# Patient Record
Sex: Female | Born: 1975 | Race: White | Hispanic: No | Smoking: Former smoker
Health system: Southern US, Community
[De-identification: ages and names within clinical notes are randomized; demographics above are authoritative.]

## PROBLEM LIST (undated history)

## (undated) DIAGNOSIS — R350 Frequency of micturition: Secondary | ICD-10-CM

## (undated) DIAGNOSIS — Z6841 Body Mass Index (BMI) 40.0 and over, adult: Secondary | ICD-10-CM

## (undated) DIAGNOSIS — E119 Type 2 diabetes mellitus without complications: Secondary | ICD-10-CM

## (undated) DIAGNOSIS — N289 Disorder of kidney and ureter, unspecified: Secondary | ICD-10-CM

## (undated) DIAGNOSIS — G4733 Obstructive sleep apnea (adult) (pediatric): Secondary | ICD-10-CM

## (undated) DIAGNOSIS — Z8619 Personal history of other infectious and parasitic diseases: Secondary | ICD-10-CM

## (undated) DIAGNOSIS — E1140 Type 2 diabetes mellitus with diabetic neuropathy, unspecified: Secondary | ICD-10-CM

## (undated) DIAGNOSIS — J449 Chronic obstructive pulmonary disease, unspecified: Secondary | ICD-10-CM

## (undated) DIAGNOSIS — Z973 Presence of spectacles and contact lenses: Secondary | ICD-10-CM

## (undated) DIAGNOSIS — R300 Dysuria: Secondary | ICD-10-CM

## (undated) DIAGNOSIS — E349 Endocrine disorder, unspecified: Secondary | ICD-10-CM

## (undated) DIAGNOSIS — Z972 Presence of dental prosthetic device (complete) (partial): Secondary | ICD-10-CM

## (undated) DIAGNOSIS — Z9989 Dependence on other enabling machines and devices: Secondary | ICD-10-CM

## (undated) DIAGNOSIS — D649 Anemia, unspecified: Secondary | ICD-10-CM

## (undated) DIAGNOSIS — R0600 Dyspnea, unspecified: Secondary | ICD-10-CM

## (undated) DIAGNOSIS — R3915 Urgency of urination: Secondary | ICD-10-CM

## (undated) DIAGNOSIS — I10 Essential (primary) hypertension: Secondary | ICD-10-CM

## (undated) DIAGNOSIS — J40 Bronchitis, not specified as acute or chronic: Secondary | ICD-10-CM

## (undated) DIAGNOSIS — R0609 Other forms of dyspnea: Secondary | ICD-10-CM

---

## 2016-02-20 ENCOUNTER — Ambulatory Visit: Payer: Self-pay | Attending: Oncology | Admitting: *Deleted

## 2016-02-20 ENCOUNTER — Other Ambulatory Visit: Payer: Self-pay | Admitting: *Deleted

## 2016-02-20 ENCOUNTER — Ambulatory Visit
Admission: RE | Admit: 2016-02-20 | Discharge: 2016-02-20 | Disposition: A | Discharge status: Home / Self Care | Payer: Self-pay | Source: Ambulatory Visit | Attending: Oncology | Admitting: Oncology

## 2016-02-20 VITALS — BP 135/85 | HR 83 | Temp 98.5°F | Ht 64.17 in | Wt 311.8 lb

## 2016-02-20 DIAGNOSIS — Z0000 Encounter for general adult medical examination without abnormal findings: Secondary | ICD-10-CM

## 2016-02-20 NOTE — Progress Notes (Signed)
Subjective:     Patient ID: Haley SicksBetsy Rains, female   DOB: Nov 15, 1975, 40 y.o.   MRN: [id number]  HPI   Review of Systems     Objective:   Physical Exam  Pulmonary/Chest: Right breast exhibits no inverted nipple, no mass, no nipple discharge, no skin change and no tenderness. Left breast exhibits no inverted nipple, no mass, no nipple discharge, no skin change and no tenderness. Breasts are symmetrical.       Assessment:     40 year old White female referred to [facility] by [facility] for clinical breast exam and mammogram.  Clinical breast exam unremarkable.  Taught self breast awareness.  Patient has been screened for eligibility.  She does not have any insurance, Medicare or Medicaid.  She also meets financial eligibility.  Hand-out given on the Affordable Care Act.    Plan:     Screening mammogram ordered.  Will follow-up per [facility] protocol.

## 2016-02-21 ENCOUNTER — Encounter: Payer: Self-pay | Admitting: *Deleted

## 2016-02-21 NOTE — Progress Notes (Signed)
Letter mailed from the [facility] to inform patient of her normal mammogram results.  Patient is to follow-up with annual screening in one year.  HSIS to Christy. 

## 2016-02-21 NOTE — Patient Instructions (Signed)
Gave patient hand-out, Women Staying Healthy, Active and Well from [facility], with education on breast health, pap smears, heart and colon health. 

## 2018-04-19 ENCOUNTER — Other Ambulatory Visit: Payer: Self-pay

## 2018-04-19 ENCOUNTER — Inpatient Hospital Stay (HOSPITAL_COMMUNITY)
Admission: EM | Admit: 2018-04-19 | Discharge: 2018-04-22 | DRG: 854 | Disposition: A | Discharge status: Home / Self Care | Payer: Medicare HMO | Attending: Internal Medicine | Admitting: Internal Medicine

## 2018-04-19 ENCOUNTER — Inpatient Hospital Stay (HOSPITAL_COMMUNITY): Payer: Medicare HMO | Admitting: Anesthesiology

## 2018-04-19 ENCOUNTER — Inpatient Hospital Stay (HOSPITAL_COMMUNITY): Payer: Medicare HMO

## 2018-04-19 ENCOUNTER — Encounter (HOSPITAL_COMMUNITY): Payer: Self-pay | Admitting: Emergency Medicine

## 2018-04-19 ENCOUNTER — Emergency Department (HOSPITAL_COMMUNITY): Payer: Medicare HMO

## 2018-04-19 ENCOUNTER — Encounter (HOSPITAL_COMMUNITY)
Admission: EM | Disposition: A | Discharge status: Home / Self Care | Payer: Self-pay | Source: Home / Self Care | Attending: Internal Medicine

## 2018-04-19 DIAGNOSIS — I10 Essential (primary) hypertension: Secondary | ICD-10-CM | POA: Diagnosis present

## 2018-04-19 DIAGNOSIS — Z7984 Long term (current) use of oral hypoglycemic drugs: Secondary | ICD-10-CM

## 2018-04-19 DIAGNOSIS — A419 Sepsis, unspecified organism: Principal | ICD-10-CM

## 2018-04-19 DIAGNOSIS — G4733 Obstructive sleep apnea (adult) (pediatric): Secondary | ICD-10-CM | POA: Diagnosis present

## 2018-04-19 DIAGNOSIS — N136 Pyonephrosis: Secondary | ICD-10-CM | POA: Diagnosis present

## 2018-04-19 DIAGNOSIS — N132 Hydronephrosis with renal and ureteral calculous obstruction: Secondary | ICD-10-CM | POA: Diagnosis present

## 2018-04-19 DIAGNOSIS — Z7951 Long term (current) use of inhaled steroids: Secondary | ICD-10-CM | POA: Diagnosis not present

## 2018-04-19 DIAGNOSIS — Z808 Family history of malignant neoplasm of other organs or systems: Secondary | ICD-10-CM | POA: Diagnosis not present

## 2018-04-19 DIAGNOSIS — E1142 Type 2 diabetes mellitus with diabetic polyneuropathy: Secondary | ICD-10-CM | POA: Diagnosis present

## 2018-04-19 DIAGNOSIS — Z91040 Latex allergy status: Secondary | ICD-10-CM | POA: Diagnosis not present

## 2018-04-19 DIAGNOSIS — Z885 Allergy status to narcotic agent status: Secondary | ICD-10-CM | POA: Diagnosis not present

## 2018-04-19 DIAGNOSIS — N179 Acute kidney failure, unspecified: Secondary | ICD-10-CM | POA: Diagnosis present

## 2018-04-19 DIAGNOSIS — E119 Type 2 diabetes mellitus without complications: Secondary | ICD-10-CM

## 2018-04-19 DIAGNOSIS — Z87891 Personal history of nicotine dependence: Secondary | ICD-10-CM

## 2018-04-19 DIAGNOSIS — Z8052 Family history of malignant neoplasm of bladder: Secondary | ICD-10-CM

## 2018-04-19 DIAGNOSIS — Z8619 Personal history of other infectious and parasitic diseases: Secondary | ICD-10-CM

## 2018-04-19 DIAGNOSIS — N1330 Unspecified hydronephrosis: Secondary | ICD-10-CM

## 2018-04-19 DIAGNOSIS — E872 Acidosis, unspecified: Secondary | ICD-10-CM | POA: Diagnosis present

## 2018-04-19 DIAGNOSIS — E785 Hyperlipidemia, unspecified: Secondary | ICD-10-CM | POA: Diagnosis present

## 2018-04-19 DIAGNOSIS — N390 Urinary tract infection, site not specified: Secondary | ICD-10-CM | POA: Diagnosis present

## 2018-04-19 DIAGNOSIS — J449 Chronic obstructive pulmonary disease, unspecified: Secondary | ICD-10-CM | POA: Diagnosis present

## 2018-04-19 DIAGNOSIS — Z6841 Body Mass Index (BMI) 40.0 and over, adult: Secondary | ICD-10-CM

## 2018-04-19 DIAGNOSIS — N10 Acute tubulo-interstitial nephritis: Secondary | ICD-10-CM

## 2018-04-19 DIAGNOSIS — N12 Tubulo-interstitial nephritis, not specified as acute or chronic: Secondary | ICD-10-CM | POA: Diagnosis not present

## 2018-04-19 DIAGNOSIS — N131 Hydronephrosis with ureteral stricture, not elsewhere classified: Secondary | ICD-10-CM

## 2018-04-19 DIAGNOSIS — D72829 Elevated white blood cell count, unspecified: Secondary | ICD-10-CM | POA: Diagnosis not present

## 2018-04-19 HISTORY — DX: Personal history of other infectious and parasitic diseases: Z86.19

## 2018-04-19 HISTORY — DX: Essential (primary) hypertension: I10

## 2018-04-19 HISTORY — DX: Chronic obstructive pulmonary disease, unspecified: J44.9

## 2018-04-19 HISTORY — PX: CYSTOSCOPY WITH STENT PLACEMENT: SHX5790

## 2018-04-19 LAB — BASIC METABOLIC PANEL
Anion gap: 14 (ref 5–15)
BUN: 35 mg/dL — AB (ref 6–20)
CHLORIDE: 101 mmol/L (ref 98–111)
CO2: 21 mmol/L — AB (ref 22–32)
CREATININE: 2.75 mg/dL — AB (ref 0.44–1.00)
Calcium: 8.4 mg/dL — ABNORMAL LOW (ref 8.9–10.3)
GFR calc Af Amer: 23 mL/min — ABNORMAL LOW (ref >60)
GFR calc non Af Amer: 20 mL/min — ABNORMAL LOW (ref >60)
Glucose, Bld: 168 mg/dL — ABNORMAL HIGH (ref 70–99)
POTASSIUM: 5 mmol/L (ref 3.5–5.1)
Sodium: 136 mmol/L (ref 135–145)

## 2018-04-19 LAB — CBC WITH DIFFERENTIAL/PLATELET
BASOS ABS: 0 10*3/uL (ref 0.0–0.1)
Basophils Relative: 0 %
Eosinophils Absolute: 0 10*3/uL (ref 0.0–0.7)
Eosinophils Relative: 0 %
HEMATOCRIT: 39.9 % (ref 36.0–46.0)
Hemoglobin: 13.5 g/dL (ref 12.0–15.0)
LYMPHS PCT: 5 %
Lymphs Abs: 1.1 10*3/uL (ref 0.7–4.0)
MCH: 31.9 pg (ref 26.0–34.0)
MCHC: 33.8 g/dL (ref 30.0–36.0)
MCV: 94.3 fL (ref 78.0–100.0)
MONO ABS: 1.9 10*3/uL — AB (ref 0.1–1.0)
Monocytes Relative: 9 %
NEUTROS ABS: 17.7 10*3/uL — AB (ref 1.7–7.7)
Neutrophils Relative %: 86 %
Platelets: 191 10*3/uL (ref 150–400)
RBC: 4.23 MIL/uL (ref 3.87–5.11)
RDW: 15.7 % — AB (ref 11.5–15.5)
WBC: 20.7 10*3/uL — ABNORMAL HIGH (ref 4.0–10.5)

## 2018-04-19 LAB — HEPATIC FUNCTION PANEL
ALK PHOS: 51 U/L (ref 38–126)
ALT: 22 U/L (ref 0–44)
AST: 15 U/L (ref 15–41)
Albumin: 3.1 g/dL — ABNORMAL LOW (ref 3.5–5.0)
BILIRUBIN DIRECT: 0.3 mg/dL — AB (ref 0.0–0.2)
BILIRUBIN INDIRECT: 0.8 mg/dL (ref 0.3–0.9)
Total Bilirubin: 1.1 mg/dL (ref 0.3–1.2)
Total Protein: 6.3 g/dL — ABNORMAL LOW (ref 6.5–8.1)

## 2018-04-19 LAB — URINALYSIS, ROUTINE W REFLEX MICROSCOPIC
Glucose, UA: NEGATIVE mg/dL
KETONES UR: NEGATIVE mg/dL
Nitrite: NEGATIVE
PROTEIN: 30 mg/dL — AB
Specific Gravity, Urine: 1.024 (ref 1.005–1.030)
WBC, UA: >50 WBC/hpf — ABNORMAL HIGH (ref 0–5)
pH: 5 (ref 5.0–8.0)

## 2018-04-19 LAB — I-STAT CG4 LACTIC ACID, ED: Lactic Acid, Venous: 4.02 mmol/L (ref 0.5–1.9)

## 2018-04-19 LAB — CBG MONITORING, ED: Glucose-Capillary: 139 mg/dL — ABNORMAL HIGH (ref 70–99)

## 2018-04-19 LAB — LIPASE, BLOOD: Lipase: 17 U/L (ref 11–51)

## 2018-04-19 LAB — LACTIC ACID, PLASMA: Lactic Acid, Venous: 3.3 mmol/L (ref 0.5–1.9)

## 2018-04-19 LAB — PREGNANCY, URINE: PREG TEST UR: NEGATIVE

## 2018-04-19 SURGERY — CYSTOSCOPY, WITH STENT INSERTION
Anesthesia: General | Laterality: Right

## 2018-04-19 MED ORDER — INSULIN GLARGINE 100 UNIT/ML ~~LOC~~ SOLN
10.0000 [IU] | Freq: Every day | SUBCUTANEOUS | Status: DC
  Administered 2018-04-19: 10 [IU] via SUBCUTANEOUS
  Filled 2018-04-19 (×2): qty 0.1

## 2018-04-19 MED ORDER — SODIUM CHLORIDE 0.9 % IV SOLN
1000.0000 mL | INTRAVENOUS | Status: DC
  Administered 2018-04-20 – 2018-04-22 (×4): 1000 mL via INTRAVENOUS

## 2018-04-19 MED ORDER — INSULIN ASPART 100 UNIT/ML ~~LOC~~ SOLN: 0.0000 [IU] | Freq: Every day | SUBCUTANEOUS | Status: DC

## 2018-04-19 MED ORDER — FENTANYL CITRATE (PF) 100 MCG/2ML IJ SOLN
INTRAMUSCULAR | Status: DC | PRN
  Administered 2018-04-19: 100 ug via INTRAVENOUS
  Administered 2018-04-19: 50 ug via INTRAVENOUS

## 2018-04-19 MED ORDER — MIDAZOLAM HCL 2 MG/2ML IJ SOLN
INTRAMUSCULAR | Status: AC
  Filled 2018-04-19: qty 2

## 2018-04-19 MED ORDER — SODIUM CHLORIDE 0.9 % IR SOLN
Status: DC | PRN
  Administered 2018-04-19: 1000 mL

## 2018-04-19 MED ORDER — SODIUM CHLORIDE 0.9 % IV BOLUS
1000.0000 mL | Freq: Once | INTRAVENOUS | Status: AC
  Administered 2018-04-19: 1000 mL via INTRAVENOUS

## 2018-04-19 MED ORDER — LACTATED RINGERS IV SOLN: INTRAVENOUS | Status: DC | PRN

## 2018-04-19 MED ORDER — ACETAMINOPHEN 325 MG PO TABS
650.0000 mg | ORAL_TABLET | Freq: Four times a day (QID) | ORAL | Status: DC | PRN
  Administered 2018-04-20 – 2018-04-22 (×3): 650 mg via ORAL
  Filled 2018-04-19 (×3): qty 2

## 2018-04-19 MED ORDER — SENNOSIDES-DOCUSATE SODIUM 8.6-50 MG PO TABS
1.0000 | ORAL_TABLET | Freq: Every evening | ORAL | Status: DC | PRN
Start: 2018-04-19 — End: 2018-04-22

## 2018-04-19 MED ORDER — FENTANYL CITRATE (PF) 100 MCG/2ML IJ SOLN
50.0000 ug | Freq: Once | INTRAMUSCULAR | Status: AC
  Administered 2018-04-19: 50 ug via INTRAVENOUS
  Filled 2018-04-19: qty 2

## 2018-04-19 MED ORDER — GABAPENTIN 300 MG PO CAPS
300.0000 mg | ORAL_CAPSULE | Freq: Three times a day (TID) | ORAL | Status: DC
  Administered 2018-04-19 – 2018-04-22 (×8): 300 mg via ORAL
  Filled 2018-04-19 (×8): qty 1

## 2018-04-19 MED ORDER — MOMETASONE FURO-FORMOTEROL FUM 200-5 MCG/ACT IN AERO
2.0000 | INHALATION_SPRAY | Freq: Two times a day (BID) | RESPIRATORY_TRACT | Status: DC
  Administered 2018-04-19 – 2018-04-22 (×6): 2 via RESPIRATORY_TRACT
  Filled 2018-04-19 (×2): qty 8.8

## 2018-04-19 MED ORDER — EPHEDRINE SULFATE 50 MG/ML IJ SOLN
INTRAMUSCULAR | Status: DC | PRN
  Administered 2018-04-19: 15 mg via INTRAVENOUS

## 2018-04-19 MED ORDER — ONDANSETRON HCL 4 MG/2ML IJ SOLN
4.0000 mg | Freq: Once | INTRAMUSCULAR | Status: AC
  Administered 2018-04-19: 4 mg via INTRAVENOUS
  Filled 2018-04-19: qty 2

## 2018-04-19 MED ORDER — SODIUM CHLORIDE 0.9 % IV SOLN
INTRAVENOUS | Status: DC | PRN
  Administered 2018-04-19: 18:00:00 via INTRAVENOUS

## 2018-04-19 MED ORDER — EPHEDRINE SULFATE 50 MG/ML IJ SOLN
INTRAMUSCULAR | Status: AC
  Filled 2018-04-19: qty 1

## 2018-04-19 MED ORDER — PRAVASTATIN SODIUM 40 MG PO TABS
40.0000 mg | ORAL_TABLET | Freq: Every day | ORAL | Status: DC
  Administered 2018-04-20 – 2018-04-21 (×2): 40 mg via ORAL
  Filled 2018-04-19 (×2): qty 1

## 2018-04-19 MED ORDER — ONDANSETRON HCL 4 MG PO TABS: 4.0000 mg | ORAL_TABLET | Freq: Four times a day (QID) | ORAL | Status: DC | PRN

## 2018-04-19 MED ORDER — FENTANYL CITRATE (PF) 250 MCG/5ML IJ SOLN
INTRAMUSCULAR | Status: AC
  Filled 2018-04-19: qty 5

## 2018-04-19 MED ORDER — LACTATED RINGERS IV SOLN: INTRAVENOUS | Status: DC

## 2018-04-19 MED ORDER — SODIUM CHLORIDE 0.9 % IV SOLN
1.0000 g | Freq: Once | INTRAVENOUS | Status: AC
  Administered 2018-04-19: 1 g via INTRAVENOUS
  Filled 2018-04-19: qty 10

## 2018-04-19 MED ORDER — FLUTICASONE PROPIONATE 50 MCG/ACT NA SUSP
1.0000 | Freq: Every day | NASAL | Status: DC
  Administered 2018-04-19 – 2018-04-20 (×2): 1 via NASAL
  Filled 2018-04-19 (×2): qty 16

## 2018-04-19 MED ORDER — PROPOFOL 10 MG/ML IV BOLUS
INTRAVENOUS | Status: DC | PRN
  Administered 2018-04-19: 200 mg via INTRAVENOUS

## 2018-04-19 MED ORDER — AMLODIPINE BESYLATE 5 MG PO TABS: 5.0000 mg | ORAL_TABLET | Freq: Every day | ORAL | Status: DC

## 2018-04-19 MED ORDER — MEPERIDINE HCL 50 MG/ML IJ SOLN: 6.2500 mg | INTRAMUSCULAR | Status: DC | PRN

## 2018-04-19 MED ORDER — SUCCINYLCHOLINE CHLORIDE 20 MG/ML IJ SOLN
INTRAMUSCULAR | Status: DC | PRN
  Administered 2018-04-19: 100 mg via INTRAVENOUS

## 2018-04-19 MED ORDER — DIATRIZOATE MEGLUMINE 30 % UR SOLN
URETHRAL | Status: DC | PRN
  Administered 2018-04-19: 20 mL via URETHRAL

## 2018-04-19 MED ORDER — INSULIN ASPART 100 UNIT/ML ~~LOC~~ SOLN
0.0000 [IU] | Freq: Three times a day (TID) | SUBCUTANEOUS | Status: DC
  Administered 2018-04-20: 3 [IU] via SUBCUTANEOUS

## 2018-04-19 MED ORDER — HYDROCODONE-ACETAMINOPHEN 5-325 MG PO TABS: 1.0000 | ORAL_TABLET | ORAL | Status: DC | PRN

## 2018-04-19 MED ORDER — SODIUM CHLORIDE 0.9 % IV SOLN
1.0000 g | INTRAVENOUS | Status: DC
  Administered 2018-04-20 – 2018-04-21 (×2): 1 g via INTRAVENOUS
  Filled 2018-04-19 (×2): qty 1
  Filled 2018-04-19 (×2): qty 10

## 2018-04-19 MED ORDER — ACETAMINOPHEN 650 MG RE SUPP: 650.0000 mg | Freq: Four times a day (QID) | RECTAL | Status: DC | PRN

## 2018-04-19 MED ORDER — PROPOFOL 10 MG/ML IV BOLUS
INTRAVENOUS | Status: AC
  Filled 2018-04-19: qty 40

## 2018-04-19 MED ORDER — MIDAZOLAM HCL 2 MG/2ML IJ SOLN
INTRAMUSCULAR | Status: DC | PRN
  Administered 2018-04-19: 2 mg via INTRAVENOUS

## 2018-04-19 MED ORDER — HYDROXYZINE HCL 25 MG PO TABS
25.0000 mg | ORAL_TABLET | Freq: Every evening | ORAL | Status: DC | PRN
  Administered 2018-04-19: 25 mg via ORAL
  Filled 2018-04-19: qty 1

## 2018-04-19 MED ORDER — PHENYLEPHRINE HCL 10 MG/ML IJ SOLN
INTRAMUSCULAR | Status: DC | PRN
  Administered 2018-04-19: 50 ug via INTRAVENOUS
  Administered 2018-04-19: 100 ug via INTRAVENOUS

## 2018-04-19 MED ORDER — ONDANSETRON HCL 4 MG/2ML IJ SOLN: 4.0000 mg | Freq: Four times a day (QID) | INTRAMUSCULAR | Status: DC | PRN

## 2018-04-19 MED ORDER — SODIUM CHLORIDE 0.9 % IJ SOLN
INTRAMUSCULAR | Status: AC
  Filled 2018-04-19: qty 10

## 2018-04-19 MED ORDER — HYDROMORPHONE HCL 1 MG/ML IJ SOLN
0.2500 mg | INTRAMUSCULAR | Status: DC | PRN
  Administered 2018-04-19 – 2018-04-20 (×3): 0.5 mg via INTRAVENOUS
  Filled 2018-04-19 (×3): qty 0.5

## 2018-04-19 MED ORDER — SODIUM CHLORIDE 0.9 % IV BOLUS (SEPSIS)
1000.0000 mL | Freq: Once | INTRAVENOUS | Status: AC
  Administered 2018-04-19: 1000 mL via INTRAVENOUS

## 2018-04-19 MED ORDER — PROMETHAZINE HCL 25 MG/ML IJ SOLN: 6.2500 mg | INTRAMUSCULAR | Status: DC | PRN

## 2018-04-19 MED ORDER — DIATRIZOATE MEGLUMINE 30 % UR SOLN
URETHRAL | Status: AC
  Filled 2018-04-19: qty 100

## 2018-04-19 SURGICAL SUPPLY — 18 items
CATH URET 5FR 28IN OPEN ENDED (CATHETERS) ×3 IMPLANT
CLOTH BEACON ORANGE TIMEOUT ST (SAFETY) ×3 IMPLANT
GLOVE BIOGEL PI IND STRL 6.5 (GLOVE) ×1 IMPLANT
GLOVE BIOGEL PI IND STRL 7.0 (GLOVE) ×2 IMPLANT
GLOVE BIOGEL PI INDICATOR 6.5 (GLOVE) ×2
GLOVE BIOGEL PI INDICATOR 7.0 (GLOVE) ×4
GLOVE SURG SS PI 8.0 STRL IVOR (GLOVE) ×3 IMPLANT
GOWN STRL REUS W/TWL LRG LVL3 (GOWN DISPOSABLE) ×3 IMPLANT
GOWN STRL REUS W/TWL XL LVL3 (GOWN DISPOSABLE) ×3 IMPLANT
GUIDEWIRE ANG ZIPWIRE 038X150 (WIRE) ×3 IMPLANT
GUIDEWIRE STR DUAL SENSOR (WIRE) ×3 IMPLANT
KIT TURNOVER CYSTO (KITS) ×3 IMPLANT
NS IRRIG 500ML POUR BTL (IV SOLUTION) IMPLANT
PACK CYSTO (CUSTOM PROCEDURE TRAY) ×3 IMPLANT
PAD ARMBOARD 7.5X6 YLW CONV (MISCELLANEOUS) ×3 IMPLANT
STENT CONTOUR 6FRX24X.038 (STENTS) ×3 IMPLANT
TOWEL OR 17X26 4PK STRL BLUE (TOWEL DISPOSABLE) ×3 IMPLANT
TRAY FOLEY MTR SLVR 16FR STAT (SET/KITS/TRAYS/PACK) ×3 IMPLANT

## 2018-04-19 NOTE — H&P (Addendum)
History and Physical  Haley Burgess ZOX:[id number] DOB: 06/22/1976 DOA: 04/19/2018  Referring physician: Dr Lynelle Doctor, ED physician PCP: Patient, No Pcp Per  Outpatient Specialists: none  Patient Coming From: home  Chief Complaint: back pain  HPI: Haley Burgess is a 42 y.o. female with a history of Dm2, CHTN, COPD. Presents with right flank pain that started 2 days ago and has been worsening.  Initially, she thought the pain was related to her hip, but noticed that she was having some very dark-colored urine.  She presented to [facility] for evaluation.  She had lab tests and x-ray and told that she had a UTI, prescribed antibiotics and hydrocodone and Naprosyn and sent home.  Patient continued to have pain and nausea and presented here for reevaluation.  She feels like she cannot urinate properly.  No palliating or provoking factors.  Emergency Department Course: CT shows obstruction of the right kidney at the UPJ with marked hydronephrosis and cortical thinning, suggestive of some level of chronicity.  White count is 20,000 with a creatinine of 2.75.  Patient started on Rocephin  Review of Systems:    Pt denies any fevers, chills, nausea, vomiting, diarrhea, constipation, abdominal pain, shortness of breath, dyspnea on exertion, orthopnea, cough, wheezing, palpitations, headache, vision changes, lightheadedness, dizziness, melena, rectal bleeding.  Review of systems are otherwise negative  Past Medical History:  Diagnosis Date  . COPD (chronic obstructive pulmonary disease) (HCC)   . Diabetes mellitus without complication (HCC)   . Hypertension    History reviewed. No pertinent surgical history. Social History:  reports that she quit smoking about 8 years ago. Her smoking use included cigarettes. She has never used smokeless tobacco. She reports that she drinks alcohol. Her drug history is not on file. Patient lives at home  Allergies  Allergen Reactions  . Norco  [Hydrocodone-Acetaminophen] Nausea And Vomiting  . Latex Rash    Family History  Problem Relation Age of Onset  . Bladder Cancer Mother   . Skin cancer Father   . Skin cancer Sister       Prior to Admission medications   Medication Sig Start Date End Date Taking? Authorizing Provider  budesonide-formoterol (SYMBICORT) 160-4.5 MCG/ACT inhaler Inhale 2 puffs into the lungs 2 (two) times daily.   Yes [provider]  Cholecalciferol (VITAMIN D) 2000 units CAPS Take 1 capsule by mouth every evening.   Yes [provider]  fluticasone (FLONASE) 50 MCG/ACT nasal spray Place 1 spray into both nostrils daily.   Yes [provider]  furosemide (LASIX) 40 MG tablet Take 40 mg by mouth daily as needed for fluid or edema.   Yes [provider]  gabapentin (NEURONTIN) 300 MG capsule Take 300 mg by mouth 3 (three) times daily.   Yes [provider]  hydrOXYzine (ATARAX/VISTARIL) 25 MG tablet Take 25 mg by mouth at bedtime as needed for anxiety.   Yes [provider]  lisinopril (PRINIVIL,ZESTRIL) 10 MG tablet Take 10 mg by mouth every evening.   Yes [provider]  lovastatin (MEVACOR) 40 MG tablet Take 80 mg by mouth at bedtime.   Yes [provider]  Melatonin 10 MG TABS Take 10 mg by mouth at bedtime.   Yes [provider]  metFORMIN (GLUCOPHAGE) 1000 MG tablet Take 1,000 mg by mouth 2 (two) times daily with a meal.   Yes [provider]  Omega-3 Fatty Acids (OMERA) 1000 MG CAPS Take 1 capsule by mouth 2 (two) times daily.  Yes [provider]  silver sulfADIAZINE (SILVADENE) 1 % cream APPLY TO THE AFFECTED AREA(S) ONCE DAILY FOR 20 DAYS 04/10/18  Yes [provider]    Physical Exam: BP (!) 123/91   Pulse 85   Temp 98.3 F (36.8 C) (Oral)   Resp 18   Ht 5\' 2"  (1.575 m)   Wt (!) 140.6 kg (310 lb)   SpO2 94%   BMI 56.70 kg/m   . General: Middle-aged Caucasian female. Awake and alert  and oriented x3. No acute cardiopulmonary distress.  Marland Kitchen HEENT: Normocephalic atraumatic.  Right and left ears normal in appearance.  Pupils equal, round, reactive to light. Extraocular muscles are intact. Sclerae anicteric and noninjected.  Moist mucosal membranes. No mucosal lesions.  . Neck: Neck supple without lymphadenopathy. No carotid bruits. No masses palpated.  . Cardiovascular: Regular rate with normal S1-S2 sounds. No murmurs, rubs, gallops auscultated. No JVD.  Marland Kitchen Respiratory: Good respiratory effort with no wheezes, rales, rhonchi. Lungs clear to auscultation bilaterally.  No accessory muscle use. . Abdomen: Obese.  Soft, nontender, nondistended. Active bowel sounds. No masses or hepatosplenomegaly  . Skin: No rashes, lesions, or ulcerations.  Dry, warm to touch. 2+ dorsalis pedis and radial pulses.  Chronic venous stasis changes to lower extremities bilaterally. . Musculoskeletal: CVA tenderness on the right.  No calf or leg pain. All major joints not erythematous nontender.  No upper or lower joint deformation.  Good ROM.  No contractures  . Psychiatric: Intact judgment and insight. Pleasant and cooperative. . Neurologic: No focal neurological deficits. Strength is 5/5 and symmetric in upper and lower extremities.  Cranial nerves II through XII are grossly intact.           Labs on Admission: I have personally reviewed following labs and imaging studies  CBC: Recent Labs  Lab 04/19/18 1418  WBC 20.7*  NEUTROABS 17.7*  HGB 13.5  HCT 39.9  MCV 94.3  PLT 191   Basic Metabolic Panel: Recent Labs  Lab 04/19/18 1418  NA 136  K 5.0  CL 101  CO2 21*  GLUCOSE 168*  BUN 35*  CREATININE 2.75*  CALCIUM 8.4*   GFR: Estimated Creatinine Clearance: 35.6 mL/min (A) (by C-G formula based on SCr of 2.75 mg/dL (H)). Liver Function Tests: Recent Labs  Lab 04/19/18 1418  AST 15  ALT 22  ALKPHOS 51  BILITOT 1.1  PROT 6.3*  ALBUMIN 3.1*   Recent Labs  Lab 04/19/18 1418    LIPASE 17   No results for input(s): AMMONIA in the last 168 hours. Coagulation Profile: No results for input(s): INR, PROTIME in the last 168 hours. Cardiac Enzymes: No results for input(s): CKTOTAL, CKMB, CKMBINDEX, TROPONINI in the last 168 hours. BNP (last 3 results) No results for input(s): PROBNP in the last 8760 hours. HbA1C: No results for input(s): HGBA1C in the last 72 hours. CBG: No results for input(s): GLUCAP in the last 168 hours. Lipid Profile: No results for input(s): CHOL, HDL, LDLCALC, TRIG, CHOLHDL, LDLDIRECT in the last 72 hours. Thyroid Function Tests: No results for input(s): TSH, T4TOTAL, FREET4, T3FREE, THYROIDAB in the last 72 hours. Anemia Panel: No results for input(s): VITAMINB12, FOLATE, FERRITIN, TIBC, IRON, RETICCTPCT in the last 72 hours. Urine analysis:    Component Value Date/Time   COLORURINE AMBER (A) 04/19/2018 1407   APPEARANCEUR CLOUDY (A) 04/19/2018 1407   LABSPEC 1.024 04/19/2018 1407   PHURINE 5.0 04/19/2018 1407   GLUCOSEU NEGATIVE 04/19/2018 1407   HGBUR MODERATE (  A) 04/19/2018 1407   BILIRUBINUR SMALL (A) 04/19/2018 1407   KETONESUR NEGATIVE 04/19/2018 1407   PROTEINUR 30 (A) 04/19/2018 1407   NITRITE NEGATIVE 04/19/2018 1407   LEUKOCYTESUR MODERATE (A) 04/19/2018 1407   Sepsis Labs: @LABRCNTIP (procalcitonin:4,lacticidven:4) )No results found for this or any previous visit (from the past 240 hour(s)).   Radiological Exams on Admission: Ct Renal Stone Study  Result Date: 04/19/2018 CLINICAL DATA:  Right-sided flank pain EXAM: CT ABDOMEN AND PELVIS WITHOUT CONTRAST TECHNIQUE: Multidetector CT imaging of the abdomen and pelvis was performed following the standard protocol without IV contrast. COMPARISON:  None. FINDINGS: Lower chest: Subsegmental atelectasis in the lung bases. Hepatobiliary: The liver measures 18.7 cm cranial caudal length. The liver shows diffusely decreased attenuation suggesting steatosis. Gallbladder is  distended. No intrahepatic or extrahepatic biliary dilation. Pancreas: No focal mass lesion. No dilatation of the main duct. No intraparenchymal cyst. No peripancreatic edema. Spleen: No splenomegaly. No focal mass lesion. Adrenals/Urinary Tract: No adrenal nodule or mass. Marked right hydronephrosis is associated with overlying cortical thinning. No associated right hydroureter. No stone disease in the right kidney and no obstructing lesion is identified at the UPJ. 2.1 cm exophytic interpolar left renal lesion measures water attenuation and is most compatible with a cyst. Left kidney unremarkable.  Left ureter unremarkable. Bladder is decompressed. Stomach/Bowel: Stomach is nondistended. No gastric wall thickening. No evidence of outlet obstruction. Duodenum is normally positioned as is the ligament of Treitz. No small bowel wall thickening. No small bowel dilatation. The terminal ileum is normal. The appendix is normal. No gross colonic mass. No colonic wall thickening. No substantial diverticular change. Vascular/Lymphatic: No abdominal aortic aneurysm. There is no gastrohepatic or hepatoduodenal ligament lymphadenopathy. No intraperitoneal or retroperitoneal lymphadenopathy. No pelvic sidewall lymphadenopathy. Reproductive: Uterine fibroids, some of which are exophytic. There is no adnexal mass. Other: Small volume intraperitoneal free fluid. Musculoskeletal: No worrisome lytic or sclerotic osseous abnormality. Small umbilical hernia contains only fat. IMPRESSION: 1. Marked right hydronephrosis with overlying cortical thinning, suggesting chronicity. Level of obstruction is at the UPJ and may be congenital although nonvisualized obstructing UPJ lesion not excluded. No urinary stone disease. Fairly substantial right perinephric edema/inflammation and superinfection of the right kidney is a concern. 2. Hepatic steatosis with hepatomegaly. 3. Uterine fibroids. 4. Small volume intraperitoneal free fluid.  Electronically Signed   By: Kennith CenterEric  Mansell M.D.   On: 04/19/2018 15:35       Assessment/Plan: Principal Problem:   Hydronephrosis due to obstruction of ureter Active Problems:   Hypertension   Diabetes mellitus without complication (HCC)   COPD (chronic obstructive pulmonary disease) (HCC)   Acute renal injury (HCC)   Acute lower UTI   Lactic acidosis    This patient was discussed with the ED physician, including pertinent vitals, physical exam findings, labs, and imaging.  We also discussed care given by the ED provider.  1. Hydronephrosis due to obstruction of ureter a. Urology to see patient, plan for procedure tonight to relieve obstruction b. Continue pain management 2. Acute lower UTI a. Rocephin b. Urine culture pending c. Check CBC tomorrow 3. Acute renal injury a. Patient is received 2 L IV fluid bolus.  Will continue IV fluids and check creatinine in the morning 4. Diabetes a. Hold metformin b. Lantus 10 units subcu c. Sliding scale insulin CBGs before meals and nightly 5. Hypertension a. Hold lisinopril due to acute renal injury b. Start Norvasc 5 mg 6. COPD a. Continue control her medications 7. Lactic Acidosis a. Lactic  acid returned after patient taken to OR b. OR notified c. Repeat after patient returns from OR.  DVT prophylaxis: SCDs Consultants: Urology Code Status: Full code Family Communication: Stepdaughter in the room during interview and exam Disposition Plan: Patient to return home following improvement of clinical status   Noralee Chars [facility] Pager [phone]  If 7PM-7AM, please contact night-coverage [email or web address] Password [email or web address]

## 2018-04-19 NOTE — ED Notes (Signed)
Just received lactic I stat. Results 4.02. Dr. Adrian BlackwaterStinson and Leotis ShamesLauren RN aware. OR aware also. Pt receiving 2nd L ns bolus at present.

## 2018-04-19 NOTE — ED Notes (Signed)
Dr Annabell HowellsWrenn in with pt

## 2018-04-19 NOTE — ED Notes (Signed)
Pt taken OR

## 2018-04-19 NOTE — ED Triage Notes (Signed)
Pt states she has had R sided flank pain since 7/4. Was seen yesterday at a hospital and was told she had a UTI and given an antibx. States she had an "x-ray" and was told she did not have a kidney stone. No improvement.

## 2018-04-19 NOTE — Anesthesia Preprocedure Evaluation (Signed)
Anesthesia Evaluation  Patient identified by MRN, date of birth, ID band Patient awake    Reviewed: Allergy & Precautions, H&P , NPO status , Patient's Chart, lab work & pertinent test results, reviewed documented beta blocker date and time   Airway Mallampati: III  TM Distance: >3 FB Neck ROM: full    Dental no notable dental hx.    Pulmonary neg pulmonary ROS, sleep apnea, Continuous Positive Airway Pressure Ventilation and Oxygen sleep apnea , COPD,  COPD inhaler, former smoker,    Pulmonary exam normal breath sounds clear to auscultation       Cardiovascular Exercise Tolerance: Good hypertension, On Medications negative cardio ROS   Rhythm:regular Rate:Normal     Neuro/Psych negative neurological ROS  negative psych ROS   GI/Hepatic negative GI ROS, Neg liver ROS,   Endo/Other  negative endocrine ROSdiabetes, Type obesity  Renal/GU ARF and CRFnegative Renal ROS  negative genitourinary   Musculoskeletal   Abdominal   Peds  Hematology negative hematology ROS (+)   Anesthesia Other Findings Labs reviewed K 5.0 HCG negative Cr elevated 12 lead NSR Severe OSA on CPAP COPD inhalers and O2 at 2lpm "half time" Physically deconditioned with dyspnea limited any exertional activities other than slow paced walking Advanced DM-II with peripheral neuropathy Small mouth with neck fully surrounded with redundant tissue.  Risks of airway mgmt, dental injury and aspiration related to difficult a/w discussed with pt and sister-in-law.  Both understand and agree  Reproductive/Obstetrics negative OB ROS                             Anesthesia Physical Anesthesia Plan  ASA: IV and emergent  Anesthesia Plan: General ETT   Post-op Pain Management:    Induction:   PONV Risk Score and Plan:   Airway Management Planned:   Additional Equipment:   Intra-op Plan:   Post-operative Plan:    Informed Consent: I have reviewed the patients History and Physical, chart, labs and discussed the procedure including the risks, benefits and alternatives for the proposed anesthesia with the patient or authorized representative who has indicated his/her understanding and acceptance.   Dental Advisory Given  Plan Discussed with: CRNA and Anesthesiologist  Anesthesia Plan Comments:         Anesthesia Quick Evaluation

## 2018-04-19 NOTE — ED Provider Notes (Signed)
[facility] EMERGENCY DEPARTMENT Provider Note   CSN: [id number] Arrival date & time: 04/19/18  1350     History   Chief Complaint Chief Complaint  Patient presents with  . Flank Pain    HPI Haley Burgess is a 42 y.o. female.  HPI Patient presents to the emergency room for evaluation of right lower abdominal and flank pain.  Patient states she initially started having some pain on July 4.  Initially she thought it was related to her hip.  She took a hydrocodone which started causing some nausea for her.  Patient noticed that she was having some very dark-colored urine.  She thought there might be blood in her urine.  She went to [facility].  Patient states she had laboratory tests and an x-ray of her hip.  Patient was told she had findings suggestive of a urinary tract infection.  Patient was prescribed antibiotics, hydrocodone and Naprosyn.  She has not picked up her prescriptions.  Patient has continued to have some nausea.  She is also having episodes of vomiting and persistent pain.  She does not feel like she can urinate properly.  She is worried that she is having issues with her kidneys and is worried that she might have a kidney stone. Past Medical History:  Diagnosis Date  . COPD (chronic obstructive pulmonary disease) (HCC)   . Diabetes mellitus without complication (HCC)   . Hypertension     There are no active problems to display for this patient.   History reviewed. No pertinent surgical history.   OB History   None      Home Medications    Prior to Admission medications   Medication Sig Start Date End Date Taking? Authorizing Provider  budesonide-formoterol (SYMBICORT) 160-4.5 MCG/ACT inhaler Inhale 2 puffs into the lungs 2 (two) times daily.   Yes [provider]  Cholecalciferol (VITAMIN D) 2000 units CAPS Take 1 capsule by mouth every evening.   Yes [provider]  fluticasone (FLONASE) 50 MCG/ACT nasal spray Place 1 spray into  both nostrils daily.   Yes [provider]  furosemide (LASIX) 40 MG tablet Take 40 mg by mouth daily as needed for fluid or edema.   Yes [provider]  gabapentin (NEURONTIN) 300 MG capsule Take 300 mg by mouth 3 (three) times daily.   Yes [provider]  hydrOXYzine (ATARAX/VISTARIL) 25 MG tablet Take 25 mg by mouth at bedtime as needed for anxiety.   Yes [provider]  lisinopril (PRINIVIL,ZESTRIL) 10 MG tablet Take 10 mg by mouth every evening.   Yes [provider]  lovastatin (MEVACOR) 40 MG tablet Take 80 mg by mouth at bedtime.   Yes [provider]  Melatonin 10 MG TABS Take 10 mg by mouth at bedtime.   Yes [provider]  metFORMIN (GLUCOPHAGE) 1000 MG tablet Take 1,000 mg by mouth 2 (two) times daily with a meal.   Yes [provider]  Omega-3 Fatty Acids (OMERA) 1000 MG CAPS Take 1 capsule by mouth 2 (two) times daily.   Yes [provider]  silver sulfADIAZINE (SILVADENE) 1 % cream APPLY TO THE AFFECTED AREA(S) ONCE DAILY FOR 20 DAYS 04/10/18  Yes [provider]    Family History Family History  Problem Relation Age of Onset  . Bladder Cancer Mother   . Skin cancer Father   . Skin cancer Sister     Social History Social History   Tobacco Use  . Smoking  status: Former Smoker    Types: Cigarettes    Last attempt to quit: 04/19/2010    Years since quitting: 8.0  . Smokeless tobacco: Never Used  Substance Use Topics  . Alcohol use: Yes    Comment: occ  . Drug use: Not on file     Allergies   Norco [hydrocodone-acetaminophen] and Latex   Review of Systems Review of Systems   Physical Exam Updated Vital Signs BP 108/69   Pulse 84   Temp 98.3 F (36.8 C) (Oral)   Resp 18   Ht 1.575 m (5\' 2" )   Wt (!) 140.6 kg (310 lb)   SpO2 97%   BMI 56.70 kg/m   Physical Exam  Constitutional:  Appears uncomfortable, morbidly obese  HENT:  Head: Normocephalic and  atraumatic.  Right Ear: External ear normal.  Left Ear: External ear normal.  Eyes: Conjunctivae are normal. Right eye exhibits no discharge. Left eye exhibits no discharge. No scleral icterus.  Neck: Neck supple. No tracheal deviation present.  Cardiovascular: Normal rate, regular rhythm and intact distal pulses.  Pulmonary/Chest: Effort normal and breath sounds normal. No stridor. No respiratory distress. She has no wheezes. She has no rales.  Abdominal: Soft. Bowel sounds are normal. She exhibits no distension. There is no tenderness. There is no rebound and no guarding.  No focal tenderness on abdominal  Musculoskeletal: She exhibits edema. She exhibits no tenderness.  Neurological: She is alert. She has normal strength. No cranial nerve deficit (no facial droop, extraocular movements intact, no slurred speech) or sensory deficit. She exhibits normal muscle tone. She displays no seizure activity. Coordination normal.  Skin: Skin is warm and dry. No rash noted. She is not diaphoretic.  Psychiatric: She has a normal mood and affect.  Nursing note and vitals reviewed.    ED Treatments / Results  Labs (all labs ordered are listed, but only abnormal results are displayed) Labs Reviewed  URINALYSIS, ROUTINE W REFLEX MICROSCOPIC - Abnormal; Notable for the following components:      Result Value   Color, Urine AMBER (*)    APPearance CLOUDY (*)    Hgb urine dipstick MODERATE (*)    Bilirubin Urine SMALL (*)    Protein, ur 30 (*)    Leukocytes, UA MODERATE (*)    WBC, UA >50 (*)    Bacteria, UA MANY (*)    Non Squamous Epithelial 0-5 (*)    All other components within normal limits  CBC WITH DIFFERENTIAL/PLATELET - Abnormal; Notable for the following components:   WBC 20.7 (*)    RDW 15.7 (*)    Neutro Abs 17.7 (*)    Monocytes Absolute 1.9 (*)    All other components within normal limits  BASIC METABOLIC PANEL - Abnormal; Notable for the following components:   CO2 21 (*)     Glucose, Bld 168 (*)    BUN 35 (*)    Creatinine, Ser 2.75 (*)    Calcium 8.4 (*)    GFR calc non Af Amer 20 (*)    GFR calc Af Amer 23 (*)    All other components within normal limits  HEPATIC FUNCTION PANEL - Abnormal; Notable for the following components:   Total Protein 6.3 (*)    Albumin 3.1 (*)    Bilirubin, Direct 0.3 (*)    All other components within normal limits  URINE CULTURE  CULTURE, BLOOD (ROUTINE X 2)  CULTURE, BLOOD (ROUTINE X 2)  PREGNANCY, URINE  LIPASE, BLOOD  I-STAT CG4 LACTIC ACID, ED    EKG None  Radiology Ct Renal Stone Study  Result Date: 04/19/2018 CLINICAL DATA:  Right-sided flank pain EXAM: CT ABDOMEN AND PELVIS WITHOUT CONTRAST TECHNIQUE: Multidetector CT imaging of the abdomen and pelvis was performed following the standard protocol without IV contrast. COMPARISON:  None. FINDINGS: Lower chest: Subsegmental atelectasis in the lung bases. Hepatobiliary: The liver measures 18.7 cm cranial caudal length. The liver shows diffusely decreased attenuation suggesting steatosis. Gallbladder is distended. No intrahepatic or extrahepatic biliary dilation. Pancreas: No focal mass lesion. No dilatation of the main duct. No intraparenchymal cyst. No peripancreatic edema. Spleen: No splenomegaly. No focal mass lesion. Adrenals/Urinary Tract: No adrenal nodule or mass. Marked right hydronephrosis is associated with overlying cortical thinning. No associated right hydroureter. No stone disease in the right kidney and no obstructing lesion is identified at the UPJ. 2.1 cm exophytic interpolar left renal lesion measures water attenuation and is most compatible with a cyst. Left kidney unremarkable.  Left ureter unremarkable. Bladder is decompressed. Stomach/Bowel: Stomach is nondistended. No gastric wall thickening. No evidence of outlet obstruction. Duodenum is normally positioned as is the ligament of Treitz. No small bowel wall thickening. No small bowel dilatation. The  terminal ileum is normal. The appendix is normal. No gross colonic mass. No colonic wall thickening. No substantial diverticular change. Vascular/Lymphatic: No abdominal aortic aneurysm. There is no gastrohepatic or hepatoduodenal ligament lymphadenopathy. No intraperitoneal or retroperitoneal lymphadenopathy. No pelvic sidewall lymphadenopathy. Reproductive: Uterine fibroids, some of which are exophytic. There is no adnexal mass. Other: Small volume intraperitoneal free fluid. Musculoskeletal: No worrisome lytic or sclerotic osseous abnormality. Small umbilical hernia contains only fat. IMPRESSION: 1. Marked right hydronephrosis with overlying cortical thinning, suggesting chronicity. Level of obstruction is at the UPJ and may be congenital although nonvisualized obstructing UPJ lesion not excluded. No urinary stone disease. Fairly substantial right perinephric edema/inflammation and superinfection of the right kidney is a concern. 2. Hepatic steatosis with hepatomegaly. 3. Uterine fibroids. 4. Small volume intraperitoneal free fluid. Electronically Signed   By: Kennith Center M.D.   On: 04/19/2018 15:35    Procedures .Critical Care Performed by: Linwood Dibbles, MD Authorized by: Linwood Dibbles, MD   Critical care provider statement:    Critical care time (minutes):  30   Critical care was time spent personally by me on the following activities:  Discussions with consultants, evaluation of patient's response to treatment, examination of patient, ordering and performing treatments and interventions, ordering and review of laboratory studies, ordering and review of radiographic studies, pulse oximetry, re-evaluation of patient's condition, obtaining history from patient or surrogate and review of old charts   (including critical care time)  Medications Ordered in ED Medications  cefTRIAXone (ROCEPHIN) 1 g in sodium chloride 0.9 % 100 mL IVPB (1 g Intravenous New Bag/Given 04/19/18 1552)  sodium chloride 0.9 %  bolus 1,000 mL (1,000 mLs Intravenous New Bag/Given 04/19/18 1442)  ondansetron (ZOFRAN) injection 4 mg (4 mg Intravenous Given 04/19/18 1442)  fentaNYL (SUBLIMAZE) injection 50 mcg (50 mcg Intravenous Given 04/19/18 1442)     Initial Impression / Assessment and Plan / ED Course  I have reviewed the triage vital signs and the nursing notes.  Pertinent labs & imaging results that were available during my care of the patient were reviewed by me and considered in my medical decision making (see chart for details).  Clinical Course as of Apr 19 1609  Sat Apr 19, 2018  1525 Patient's laboratory tests were notable  for an elevated white blood cell count.   [JK]  1525 BUN and creatinine are also consistent with an acute kidney injury.  I do not have any old for comparisons however.   [JK]  1526 Urinalysis is suggestive of urinary tract infection.  CT scan is pending   [JK]  1547 MCV: 94.3 [JK]  1550 D/w Dr Annabell Howells.  He reviewed the CT findings.  He will come to the hospital and plans on taking the patient to the OR for cystoscopy and ureteral stenting   [JK]    Clinical Course User Index [JK] Linwood Dibbles, MD   Patient presented to the emergency room for evaluation of flank pain in the setting of a recent diagnosis of a urinary tract infection.  Patient had not been able to pick up her medications yet from her ED visit at another hospital yesterday.  She felt like she was getting worse and was not urinating as much.  Patient's ED work-up is notable for findings consistent with pyelonephritis.  Patient CT scan shows evidence of ureteral obstruction, most likely an acute on chronic component.  Patient does have an elevation in her BUN and creatinine and she is not aware of having any chronic kidney issues.  Plan on IV hydration.  I have ordered IV antibiotics.  I spoken with Dr. Annabell Howells urology and Dr. Adrian Blackwater from the hospitalist service.  Patient will be admitted for further evaluation  Final Clinical  Impressions(s) / ED Diagnoses   Final diagnoses:  Hydronephrosis due to obstruction of ureter  Acute pyelonephritis  AKI (acute kidney injury) (HCC)      Linwood Dibbles, MD 04/19/18 [phone]

## 2018-04-19 NOTE — ED Notes (Signed)
Hospitalist in with pt assessing lung sounds prior to 2nd bolus NS given. Nad.

## 2018-04-19 NOTE — Progress Notes (Signed)
CRITICAL VALUE ALERT  Critical Value:  Lactic acid 3.3   Date & Time Notied:  04-19-18 2310   Provider Notified: Dr. Selena BattenKim  Orders Received/Actions taken: No new orders received at this time. Patient currently stable.

## 2018-04-19 NOTE — Anesthesia Postprocedure Evaluation (Signed)
Anesthesia Post Note  Patient: Haley SicksBetsy Burgess  Procedure(s) Performed: CYSTOSCOPY WITH STENT PLACEMENT (Right )  Patient location during evaluation: PACU Anesthesia Type: General Level of consciousness: awake, awake and alert, oriented and patient cooperative Pain management: pain level controlled Vital Signs Assessment: post-procedure vital signs reviewed and stable Respiratory status: spontaneous breathing and patient connected to face mask oxygen Cardiovascular status: blood pressure returned to baseline and stable Postop Assessment: no headache, no backache and no apparent nausea or vomiting Anesthetic complications: no     Last Vitals:  Vitals:   04/19/18 1730 04/19/18 1740  BP: (!) 97/44   Pulse: 80 86  Resp: 18 18  Temp:    SpO2: 92% 98%    Last Pain:  Vitals:   04/19/18 1708  TempSrc:   PainSc: 6                  Candis SchatzBenjamin G Coleen Cardiff

## 2018-04-19 NOTE — ED Notes (Signed)
Dr. Donnetta HailWreen contacted and transferred to Dr. Lynelle DoctorKnapp at 15:47 for consult.

## 2018-04-19 NOTE — Anesthesia Procedure Notes (Signed)
Procedure Name: Intubation Performed by: Arbie Cookeyameransi, Briton Sellman G, MD Pre-anesthesia Checklist: Patient identified, Emergency Drugs available, Suction available, Patient being monitored and Timeout performed Patient Re-evaluated:Patient Re-evaluated prior to induction Oxygen Delivery Method: Circle system utilized Preoxygenation: Pre-oxygenation with 100% oxygen Induction Type: IV induction Ventilation: Mask ventilation without difficulty Laryngoscope Size: Glidescope and 3 Grade View: Grade III Tube type: Oral Tube size: 7.0 mm Number of attempts: 1 Airway Equipment and Method: Oral airway,  Patient positioned with wedge pillow and Rigid stylet Placement Confirmation: ETT inserted through vocal cords under direct vision,  positive ETCO2 and breath sounds checked- equal and bilateral Tube secured with: Tape Dental Injury: Teeth and Oropharynx as per pre-operative assessment  Difficulty Due To: Difficulty was anticipated

## 2018-04-19 NOTE — Transfer of Care (Signed)
Immediate Anesthesia Transfer of Care Note  Patient: Haley Burgess  Procedure(s) Performed: CYSTOSCOPY WITH STENT PLACEMENT (Right )  Patient Location: PACU  Anesthesia Type:General  Level of Consciousness: oriented and patient cooperative  Airway & Oxygen Therapy: Patient connected to face mask oxygen  Post-op Assessment: Report given to RN and Patient moving all extremities  Post vital signs: Reviewed and stable  Last Vitals:  Vitals Value Taken Time  BP 111/53 04/19/2018  6:38 PM  Temp    Pulse 111 04/19/2018  6:42 PM  Resp 33 04/19/2018  6:42 PM  SpO2 98 % 04/19/2018  6:42 PM  Vitals shown include unvalidated device data.  Last Pain:  Vitals:   04/19/18 1708  TempSrc:   PainSc: 6          Complications: No apparent anesthesia complications

## 2018-04-19 NOTE — Discharge Instructions (Signed)

## 2018-04-19 NOTE — Op Note (Signed)
Procedure: 1.  Cystoscopy with right retrograde pyelogram and interpretation. 2.  Right double-J stent insertion.  Preop diagnosis: Right UPJ obstruction with infection.  Postop diagnosis: Right UPJ obstruction with pyonephrosis.  Surgeon: Dr. Bjorn PippinJohn Shacoria Latif.  Anesthesia: General.  Drain: 6 French by 24 cm right contour double-J stent.  Specimen: Urine culture.  EBL: None.  Complications: None.  Indications: Haley OddiBetsy is a 42 year old white female who presented to the [facility] emergency room with right flank pain, leukocytosis and urinary tract infection and on CT she was found to have a right UPJ obstruction with marked dilation of the kidney and cortical atrophy with significant perinephric stranding.  It was felt that renal decompression was indicated and after reviewing the options we are going to proceed with cystoscopy and stent insertion.  Procedure: She had taken 1 Cipro yesterday and was given Rocephin in the ER.  She was taken to the operating room where general anesthetic was induced.  She was placed in the lithotomy position.  Her perineum and genitalia were prepped with Betadine solution and she was draped in usual sterile fashion.  Cystoscopy was performed using the 23 [place]French scope and 30 degree lens.  Examination revealed a normal urethra.  The bladder wall had minimal trabeculation with no tumors or stones noted in the ureteral orifices were unremarkable.  The initial urine encountered in the bladder was concentrated and very malodorous so a specimen was sent for culture to ensure we had one strictly from the bladder.  A 5 French opening catheter was then passed up the right ureteral orifice and contrast was instilled.   The quality of the image was poor due to the patient's morbid obesity but the ureter was identified without filling defects and there appeared to be a high insertion onto a dilated renal pelvis.  I then passed a zip wire with an angled tip through the opening  catheter and was able to advance this into the renal pelvis without difficulty.  The opening catheter was removed and a 6 French 24 cm contour double-J stent without tether was passed to the kidney under fluoroscopic guidance.  The wire was removed leaving a good coil in the kidney and a good coil in the bladder.  Purulent efflux was noted from the distal loop consistent with pyonephrosis.  The cystoscope was removed and a 16 French Foley catheter was placed.  The balloon was filled with 10 cc of sterile fluid and the catheter was placed to straight drainage.  She was taken down from lithotomy position, her anesthetic was reversed and she was moved to recovery room in stable condition.  There were no complications.  She will be managed by medicine and I will arrange office follow-up in 2 to 3 weeks with a subsequent renogram to assess whether she might require a pyeloplasty or nephrectomy.

## 2018-04-19 NOTE — Consult Note (Addendum)
Subjective: CC:  Right flank pain.  Hx: Haley Burgess is a 42 yo WM who presented to the ER today with right flank pain, AKI with a leukocytosis and probable UTI.   I was consulted by Dr. 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  There is cortical thinning and marked perinephric stranding.  She was originally seen in the [place] ED on 7/4 and was given cipro but has only taken one tablet.  She has had some nausea with the pain which is intermittently severe and the norco given to manage it.   She has had some hematuria but no dysuria.  She denies recurrent UTIs or GU surgery  But has apparently had intermittent severe flank pain with N/V on occasion since she was a teen.  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  ROS:  Review of Systems  Constitutional: Positive for fever.  Respiratory: Positive for shortness of breath.   Cardiovascular: Positive for leg swelling. Negative for chest pain.  Gastrointestinal: Positive for nausea and vomiting.  Genitourinary: Positive for flank pain, hematuria and urgency.  Skin:       Venous stasis changes.   Neurological: Negative.   Endo/Heme/Allergies: Negative.   Psychiatric/Behavioral: Negative.     Allergies  Allergen Reactions  . Norco [Hydrocodone-Acetaminophen] Nausea And Vomiting  . Latex Rash    Past Medical History:  Diagnosis Date  . COPD (chronic obstructive pulmonary disease) (HCC)   . Diabetes mellitus without complication (HCC)   . Hypertension     History reviewed. No pertinent surgical history.  Social History   Socioeconomic History  . Marital status: Married    Spouse name: Not on file  . Number of children: Not on file  . Years of education: Not on file  . Highest education level: Not on file  Occupational History  . Not on file  Social Needs  . Financial resource strain: Not on file  . Food  insecurity:    Worry: Not on file    Inability: Not on file  . Transportation needs:    Medical: Not on file    Non-medical: Not on file  Tobacco Use  . Smoking status: Former Smoker    Types: Cigarettes    Last attempt to quit: 04/19/2010    Years since quitting: 8.0  . Smokeless tobacco: Never Used  Substance and Sexual Activity  . Alcohol use: Yes    Comment: occ  . Drug use: Not on file  . Sexual activity: Not on file  Lifestyle  . Physical activity:    Days per week: Not on file    Minutes per session: Not on file  . Stress: Not on file  Relationships  . Social connections:    Talks on phone: Not on file    Gets together: Not on file    Attends religious service: Not on file    Active member of club or organization: Not on file    Attends meetings of clubs or organizations: Not on file    Relationship status: Not on file  . Intimate partner violence:    Fear of current or ex partner: Not on file    Emotionally abused: Not on file    Physically abused: Not on file    Forced sexual activity: Not on file  Other Topics Concern  . Not on file  Social History Narrative  . Not on file  Family History  Problem Relation Age of Onset  . Bladder Cancer Mother   . Skin cancer Father   . Skin cancer Sister     Anti-infectives: Anti-infectives (From admission, onward)   Start     Dose/Rate Route Frequency Ordered Stop   04/19/18 1530  cefTRIAXone (ROCEPHIN) 1 g in sodium chloride 0.9 % 100 mL IVPB     1 g 200 mL/hr over 30 Minutes Intravenous  Once 04/19/18 1525        Current Facility-Administered Medications  Medication Dose Route Frequency Provider Last Rate Last Dose  . cefTRIAXone (ROCEPHIN) 1 g in sodium chloride 0.9 % 100 mL IVPB  1 g Intravenous Once Linwood DibblesKnapp, Jon, MD 200 mL/hr at 04/19/18 1552 1 g at 04/19/18 1552   Current Outpatient Medications  Medication Sig Dispense Refill  . budesonide-formoterol (SYMBICORT) 160-4.5 MCG/ACT inhaler Inhale 2 puffs into  the lungs 2 (two) times daily.    . Cholecalciferol (VITAMIN D) 2000 units CAPS Take 1 capsule by mouth every evening.    . fluticasone (FLONASE) 50 MCG/ACT nasal spray Place 1 spray into both nostrils daily.    . furosemide (LASIX) 40 MG tablet Take 40 mg by mouth daily as needed for fluid or edema.    . gabapentin (NEURONTIN) 300 MG capsule Take 300 mg by mouth 3 (three) times daily.    . hydrOXYzine (ATARAX/VISTARIL) 25 MG tablet Take 25 mg by mouth at bedtime as needed for anxiety.    Marland Kitchen. lisinopril (PRINIVIL,ZESTRIL) 10 MG tablet Take 10 mg by mouth every evening.    . lovastatin (MEVACOR) 40 MG tablet Take 80 mg by mouth at bedtime.    . Melatonin 10 MG TABS Take 10 mg by mouth at bedtime.    . metFORMIN (GLUCOPHAGE) 1000 MG tablet Take 1,000 mg by mouth 2 (two) times daily with a meal.    . Omega-3 Fatty Acids (OMERA) 1000 MG CAPS Take 1 capsule by mouth 2 (two) times daily.    . silver sulfADIAZINE (SILVADENE) 1 % cream APPLY TO THE AFFECTED AREA(S) ONCE DAILY FOR 20 DAYS  0     Objective: Vital signs in last 24 hours: Temp:  [98.3 F (36.8 C)] 98.3 F (36.8 C) (07/06 1355) Pulse Rate:  [83-93] 84 (07/06 1558) Resp:  [18] 18 (07/06 1355) BP: (108-137)/(64-105) 108/69 (07/06 1557) SpO2:  [97 %-99 %] 97 % (07/06 1558) Weight:  [140.6 kg (310 lb)] 140.6 kg (310 lb) (07/06 1357)  Intake/Output from previous day: No intake/output data recorded. Intake/Output this shift: No intake/output data recorded.   Physical Exam  Constitutional: She is oriented to person, place, and time.  Morbidly obese, WD WF in NAD   HENT:  Very round face with some features suggesting cushings  Neck: Normal range of motion. Neck supple. No thyromegaly present.  Cardiovascular: Normal rate, regular rhythm and normal heart sounds.  Pulmonary/Chest: Effort normal and breath sounds normal. No respiratory distress.  Abdominal: Soft. She exhibits distension. There is tenderness (right upper quadrant and  cvat).  Musculoskeletal: Normal range of motion. She exhibits edema and tenderness.  Lymphadenopathy:    She has no cervical adenopathy.  Neurological: She is alert and oriented to person, place, and time.  Skin: Skin is warm and dry.  Venous stasis changes of lower extremities  Psychiatric: She has a normal mood and affect.  Vitals reviewed.   Lab Results:  Recent Labs    04/19/18 1418  WBC 20.7*  HGB 13.5  HCT 39.9  PLT 191   BMET Recent Labs    04/19/18 1418  NA 136  K 5.0  CL 101  CO2 21*  GLUCOSE 168*  BUN 35*  CREATININE 2.75*  CALCIUM 8.4*   PT/INR No results for input(s): LABPROT, INR in the last 72 hours. ABG No results for input(s): PHART, HCO3 in the last 72 hours.  Invalid input(s): PCO2, PO2  Studies/Results: Ct Renal Stone Study  Result Date: 04/19/2018 CLINICAL DATA:  Right-sided flank pain EXAM: CT ABDOMEN AND PELVIS WITHOUT CONTRAST TECHNIQUE: Multidetector CT imaging of the abdomen and pelvis was performed following the standard protocol without IV contrast. COMPARISON:  None. FINDINGS: Lower chest: Subsegmental atelectasis in the lung bases. Hepatobiliary: The liver measures 18.7 cm cranial caudal length. The liver shows diffusely decreased attenuation suggesting steatosis. Gallbladder is distended. No intrahepatic or extrahepatic biliary dilation. Pancreas: No focal mass lesion. No dilatation of the main duct. No intraparenchymal cyst. No peripancreatic edema. Spleen: No splenomegaly. No focal mass lesion. Adrenals/Urinary Tract: No adrenal nodule or mass. Marked right hydronephrosis is associated with overlying cortical thinning. No associated right hydroureter. No stone disease in the right kidney and no obstructing lesion is identified at the UPJ. 2.1 cm exophytic interpolar left renal lesion measures water attenuation and is most compatible with a cyst. Left kidney unremarkable.  Left ureter unremarkable. Bladder is decompressed. Stomach/Bowel:  Stomach is nondistended. No gastric wall thickening. No evidence of outlet obstruction. Duodenum is normally positioned as is the ligament of Treitz. No small bowel wall thickening. No small bowel dilatation. The terminal ileum is normal. The appendix is normal. No gross colonic mass. No colonic wall thickening. No substantial diverticular change. Vascular/Lymphatic: No abdominal aortic aneurysm. There is no gastrohepatic or hepatoduodenal ligament lymphadenopathy. No intraperitoneal or retroperitoneal lymphadenopathy. No pelvic sidewall lymphadenopathy. Reproductive: Uterine fibroids, some of which are exophytic. There is no adnexal mass. Other: Small volume intraperitoneal free fluid. Musculoskeletal: No worrisome lytic or sclerotic osseous abnormality. Small umbilical hernia contains only fat. IMPRESSION: 1. Marked right hydronephrosis with overlying cortical thinning, suggesting chronicity. Level of obstruction is at the UPJ and may be congenital although nonvisualized obstructing UPJ lesion not excluded. No urinary stone disease. Fairly substantial right perinephric edema/inflammation and superinfection of the right kidney is a concern. 2. Hepatic steatosis with hepatomegaly. 3. Uterine fibroids. 4. Small volume intraperitoneal free fluid. Electronically Signed   By: Kennith Center M.D.   On: 04/19/2018 15:35   I have reviewed the CT films and report and discussed the case with Dr. Lynelle Doctor.  I have reviewed the labs and UA.   Assessment: 1. Right flank pain with right UPJ obstruction with possible pyelonephritis.  I am going to do cystoscopy with R retrograde and insertion of right JJ stent.   Once the infection is treated and the kidney has had time to decompress, she will need a renogram to assess relative function to determine whether she will need a pyeloplasty or a nephrectomy.    I reviewed the risks of the procedure including bleeding, infection, ureteral injury, need for a percutaneous nephrostomy  tube and other secondary procedures, thrombotic events and anesthetic complications.     CC: Dr. Iantha Fallen    Bjorn Pippin 04/19/2018 [phone]

## 2018-04-19 NOTE — Anesthesia Postprocedure Evaluation (Signed)
Anesthesia Post Note  Patient: Haley Burgess  Procedure(s) Performed: CYSTOSCOPY WITH STENT PLACEMENT (Right )  Anesthesia Type: General Level of consciousness: awake and alert and patient cooperative Pain management: satisfactory to patient Vital Signs Assessment: post-procedure vital signs reviewed and stable Respiratory status: spontaneous breathing and patient connected to nasal cannula oxygen Cardiovascular status: stable Postop Assessment: no apparent nausea or vomiting Anesthetic complications: no     Last Vitals:  Vitals:   04/19/18 1915 04/19/18 1926  BP: (!) 98/50 (!) 121/48  Pulse: (!) 106 (!) 102  Resp: 19 13  Temp:  37.5 C  SpO2: 95% 97%    Last Pain:  Vitals:   04/19/18 1926  TempSrc: Oral  PainSc:                  Minerva AreolaYATES,Breindel Collier

## 2018-04-20 DIAGNOSIS — I10 Essential (primary) hypertension: Secondary | ICD-10-CM

## 2018-04-20 DIAGNOSIS — N179 Acute kidney failure, unspecified: Secondary | ICD-10-CM

## 2018-04-20 DIAGNOSIS — A419 Sepsis, unspecified organism: Principal | ICD-10-CM

## 2018-04-20 DIAGNOSIS — J449 Chronic obstructive pulmonary disease, unspecified: Secondary | ICD-10-CM

## 2018-04-20 DIAGNOSIS — E872 Acidosis: Secondary | ICD-10-CM

## 2018-04-20 DIAGNOSIS — N132 Hydronephrosis with renal and ureteral calculous obstruction: Secondary | ICD-10-CM

## 2018-04-20 DIAGNOSIS — N10 Acute tubulo-interstitial nephritis: Secondary | ICD-10-CM

## 2018-04-20 LAB — GLUCOSE, CAPILLARY
GLUCOSE-CAPILLARY: 109 mg/dL — AB (ref 70–99)
Glucose-Capillary: 109 mg/dL — ABNORMAL HIGH (ref 70–99)
Glucose-Capillary: 110 mg/dL — ABNORMAL HIGH (ref 70–99)

## 2018-04-20 LAB — CBC
HCT: 35.3 % — ABNORMAL LOW (ref 36.0–46.0)
HEMOGLOBIN: 11.3 g/dL — AB (ref 12.0–15.0)
MCH: 30.5 pg (ref 26.0–34.0)
MCHC: 32 g/dL (ref 30.0–36.0)
MCV: 95.4 fL (ref 78.0–100.0)
PLATELETS: 185 10*3/uL (ref 150–400)
RBC: 3.7 MIL/uL — AB (ref 3.87–5.11)
RDW: 16 % — ABNORMAL HIGH (ref 11.5–15.5)
WBC: 9.6 10*3/uL (ref 4.0–10.5)

## 2018-04-20 LAB — BASIC METABOLIC PANEL
ANION GAP: 10 (ref 5–15)
BUN: 46 mg/dL — ABNORMAL HIGH (ref 6–20)
CALCIUM: 7.5 mg/dL — AB (ref 8.9–10.3)
CO2: 23 mmol/L (ref 22–32)
CREATININE: 3.08 mg/dL — AB (ref 0.44–1.00)
Chloride: 104 mmol/L (ref 98–111)
GFR calc non Af Amer: 17 mL/min — ABNORMAL LOW (ref >60)
GFR, EST AFRICAN AMERICAN: 20 mL/min — AB (ref >60)
GLUCOSE: 111 mg/dL — AB (ref 70–99)
Potassium: 4.2 mmol/L (ref 3.5–5.1)
Sodium: 137 mmol/L (ref 135–145)

## 2018-04-20 LAB — LACTIC ACID, PLASMA: Lactic Acid, Venous: 1.8 mmol/L (ref 0.5–1.9)

## 2018-04-20 LAB — HEMOGLOBIN A1C
Hgb A1c MFr Bld: 6.3 % — ABNORMAL HIGH (ref 4.8–5.6)
MEAN PLASMA GLUCOSE: 134.11 mg/dL

## 2018-04-20 MED ORDER — SENNA 8.6 MG PO TABS
2.0000 | ORAL_TABLET | Freq: Every day | ORAL | Status: DC
  Filled 2018-04-20 (×3): qty 2

## 2018-04-20 MED ORDER — POLYETHYLENE GLYCOL 3350 17 G PO PACK
17.0000 g | PACK | Freq: Every day | ORAL | Status: DC
  Administered 2018-04-20: 17 g via ORAL
  Filled 2018-04-20 (×3): qty 1

## 2018-04-20 MED ORDER — INSULIN ASPART 100 UNIT/ML ~~LOC~~ SOLN
0.0000 [IU] | Freq: Three times a day (TID) | SUBCUTANEOUS | Status: DC
  Administered 2018-04-21 (×2): 1 [IU] via SUBCUTANEOUS
  Administered 2018-04-22: 2 [IU] via SUBCUTANEOUS

## 2018-04-20 MED ORDER — TRAMADOL HCL 50 MG PO TABS
50.0000 mg | ORAL_TABLET | Freq: Four times a day (QID) | ORAL | Status: DC | PRN
  Administered 2018-04-20 – 2018-04-21 (×5): 50 mg via ORAL
  Filled 2018-04-20 (×5): qty 1

## 2018-04-20 NOTE — Progress Notes (Signed)
PROGRESS NOTE  Haley Burgess ZOX:[id number] DOB: 12-07-1975 DOA: 04/19/2018 PCP: Patient, No Pcp Per  Brief History:  42 year old female with a history of diabetes mellitus, hypertension, COPD presenting with right flank pain with associated nausea and vomiting that started on 04/17/2018.  The patient went to [facility].  She was given a dose of intravenous antibiotics and discharged home with antibiotics and antiemetics and hydrocodone.  The patient continued to have right flank pain and nausea and vomiting.  She did not get her antibiotics filled.  Because of continued pain and vomiting, the patient represented to the ED on 04/19/2018.  The patient had subjective fevers and chills with some urinary urgency type symptoms.  She denied any diarrhea, hematochezia, melena, chest pain, shortness of breath.  In the emergency department, the patient was noted to have WBC 20.7 with lactic acid 4.02.  Serum creatinine was 2.75.  Urology was consulted.  The patient was taken to the operating room by Dr. Annabell Howells on the evening of 04/19/2018.  The patient underwent cystoscopy with right retrograde pyelogram and insertion of a right double-J stent.  Purulent reflux was noted after the stent was placed.  The patient was started on ceftriaxone.  Assessment/Plan: Sepsis -present at time of admission -lactic acid peaked 4.02 -continue IVF -continue IV abx pending culture data  Pyelonephritis -Continue ceftriaxone pending culture data -Unfortunately, cultures may be compromised as the patient had received antibiotics during her ED visit at [facility]; in addition, urine cultures during this admission were sent after the patient received ceftriaxone -Continue pain control -Continue IV fluids -04/19/2018 CT renal stone protocol--right hydronephrosis with associated cortical thinning; obstruction at the right UPJ with right perinephric edema and inflammation -A.m. BMP and CBC  Acute kidney  injury -Serum creatinine peaked to 3.08 -Multifactorial including obstructive uropathy and hemodynamic changes secondary to soft blood pressures -unclear renal baseline -Holding lisinopril  -repeat CT abd/pelvis if no improvement to r/o progressive obstruction -am BMP  Hypertension -Holding lisinopril and amlodipine secondary to soft blood pressure  Diabetes mellitus type 2 -Holding metformin -Hemoglobin A1c -NovoLog sliding scale  COPD -Stable on room air -Continue Dulera  Hyperlipidemia -Continue statin   Disposition Plan:   Home in 2-3 days  Family Communication:   No Family at bedside  Consultants:  urology  Code Status:  FULL   DVT Prophylaxis:  [place] Heparin / [place] Lovenox   Procedures: As Listed in Progress Note Above  Antibiotics: None    Subjective: Patient continues to have right flank pain but it is slightly improved.  She states that her nausea and vomiting have improved.  She denies any chest pain, shortness breath, headache, neck pain, dysuria, hematuria.  There is no hematochezia or melena.  Objective: Vitals:   04/19/18 2249 04/20/18 0205 04/20/18 0610 04/20/18 0730  BP:  (!) 93/53 (!) 97/52   Pulse: 78 90 82   Resp: 16 (!) 26 17   Temp:  98.4 F (36.9 C) 97.6 F (36.4 C)   TempSrc:  Oral Oral   SpO2: 98% 100% 100% 98%  Weight:      Height:        Intake/Output Summary (Last 24 hours) at 04/20/2018 1005 Last data filed at 04/20/2018 0800 Gross per 24 hour  Intake 1856.67 ml  Output 855 ml  Net 1001.67 ml   Weight change:  Exam:   General:  Pt is alert, follows commands appropriately, not in acute distress  HEENT: No icterus, No thrush, No neck mass, [place]/AT  Cardiovascular: RRR, S1/S2, no rubs, no gallops  Respiratory: Bibasilar crackles prolonged with auscultation.  No wheezing.  Abdomen: Soft/+BS, non tender, non distended, no guarding; right flank pain  Extremities: 1+LE edema, No lymphangitis, No petechiae, No rashes, no  synovitis   Data Reviewed: I have personally reviewed following labs and imaging studies Basic Metabolic Panel: Recent Labs  Lab 04/19/18 1418 04/20/18 0658  NA 136 137  K 5.0 4.2  CL 101 104  CO2 21* 23  GLUCOSE 168* 111*  BUN 35* 46*  CREATININE 2.75* 3.08*  CALCIUM 8.4* 7.5*   Liver Function Tests: Recent Labs  Lab 04/19/18 1418  AST 15  ALT 22  ALKPHOS 51  BILITOT 1.1  PROT 6.3*  ALBUMIN 3.1*   Recent Labs  Lab 04/19/18 1418  LIPASE 17   No results for input(s): AMMONIA in the last 168 hours. Coagulation Profile: No results for input(s): INR, PROTIME in the last 168 hours. CBC: Recent Labs  Lab 04/19/18 1418 04/20/18 0658  WBC 20.7* 9.6  NEUTROABS 17.7*  --   HGB 13.5 11.3*  HCT 39.9 35.3*  MCV 94.3 95.4  PLT 191 185   Cardiac Enzymes: No results for input(s): CKTOTAL, CKMB, CKMBINDEX, TROPONINI in the last 168 hours. BNP: Invalid input(s): POCBNP CBG: Recent Labs  Lab 04/19/18 1638 04/20/18 0804  GLUCAP 139* 109*   HbA1C: No results for input(s): HGBA1C in the last 72 hours. Urine analysis:    Component Value Date/Time   COLORURINE AMBER (A) 04/19/2018 1407   APPEARANCEUR CLOUDY (A) 04/19/2018 1407   LABSPEC 1.024 04/19/2018 1407   PHURINE 5.0 04/19/2018 1407   GLUCOSEU NEGATIVE 04/19/2018 1407   HGBUR MODERATE (A) 04/19/2018 1407   BILIRUBINUR SMALL (A) 04/19/2018 1407   KETONESUR NEGATIVE 04/19/2018 1407   PROTEINUR 30 (A) 04/19/2018 1407   NITRITE NEGATIVE 04/19/2018 1407   LEUKOCYTESUR MODERATE (A) 04/19/2018 1407   Sepsis Labs: @LABRCNTIP (procalcitonin:4,lacticidven:4) ) Recent Results (from the past 240 hour(s))  Blood culture (routine x 2)     Status: None (Preliminary result)   Collection Time: 04/19/18  4:30 PM  Result Value Ref Range Status   Specimen Description LEFT ANTECUBITAL  Final   Special Requests   Final    BOTTLES DRAWN AEROBIC AND ANAEROBIC Blood Culture adequate volume   Culture   Final    NO GROWTH <  24 HOURS Performed at [facility], [address], [place], [place] [address]    Report Status PENDING  Incomplete  Blood culture (routine x 2)     Status: None (Preliminary result)   Collection Time: 04/19/18  4:55 PM  Result Value Ref Range Status   Specimen Description BLOOD LEFT ARM  Final   Special Requests   Final    BOTTLES DRAWN AEROBIC AND ANAEROBIC Blood Culture adequate volume   Culture   Final    NO GROWTH < 24 HOURS Performed at [facility], [address], [place], [place] [address]    Report Status PENDING  Incomplete     Scheduled Meds: . amLODipine  5 mg Oral Daily  . fluticasone  1 spray Each Nare Daily  . gabapentin  300 mg Oral TID  . insulin aspart  0-20 Units Subcutaneous TID WC  . insulin aspart  0-5 Units Subcutaneous QHS  . insulin glargine  10 Units Subcutaneous QHS  . mometasone-formoterol  2 puff Inhalation BID  . pravastatin  40 mg Oral q1800  Continuous Infusions: . sodium chloride 1,000 mL (04/20/18 0216)  . cefTRIAXone (ROCEPHIN)  IV    . lactated ringers    . lactated ringers      Procedures/Studies: Dg Retrograde Pyelogram  Result Date: 04/19/2018 CLINICAL DATA:  Retrograde pyelogram. FLUOROSCOPY TIME:  52.3 seconds. Images: 4 EXAM: RETROGRADE PYELOGRAM COMPARISON:  CT scan April 19, 2018 FINDINGS: Four images were obtained. On the first, a wire projects over the lower abdomen/upper pelvis. On the second, the wire extends into the right side of the abdomen. On the third, the wire has been pulled back slightly. On the fourth, the wire has been withdrawn. Limited imaging. IMPRESSION: Limited imaging obtained during retrograde pyelogram. I do not see that any contrast was injected into the collecting system. I do not see a stent in place at the end of the study. Electronically Signed   By: Gerome Samavid  Williams III M.D   On: 04/19/2018 19:21   Ct Renal Stone Study  Result Date: 04/19/2018 CLINICAL DATA:  Right-sided flank pain EXAM: CT ABDOMEN AND PELVIS  WITHOUT CONTRAST TECHNIQUE: Multidetector CT imaging of the abdomen and pelvis was performed following the standard protocol without IV contrast. COMPARISON:  None. FINDINGS: Lower chest: Subsegmental atelectasis in the lung bases. Hepatobiliary: The liver measures 18.7 cm cranial caudal length. The liver shows diffusely decreased attenuation suggesting steatosis. Gallbladder is distended. No intrahepatic or extrahepatic biliary dilation. Pancreas: No focal mass lesion. No dilatation of the main duct. No intraparenchymal cyst. No peripancreatic edema. Spleen: No splenomegaly. No focal mass lesion. Adrenals/Urinary Tract: No adrenal nodule or mass. Marked right hydronephrosis is associated with overlying cortical thinning. No associated right hydroureter. No stone disease in the right kidney and no obstructing lesion is identified at the UPJ. 2.1 cm exophytic interpolar left renal lesion measures water attenuation and is most compatible with a cyst. Left kidney unremarkable.  Left ureter unremarkable. Bladder is decompressed. Stomach/Bowel: Stomach is nondistended. No gastric wall thickening. No evidence of outlet obstruction. Duodenum is normally positioned as is the ligament of Treitz. No small bowel wall thickening. No small bowel dilatation. The terminal ileum is normal. The appendix is normal. No gross colonic mass. No colonic wall thickening. No substantial diverticular change. Vascular/Lymphatic: No abdominal aortic aneurysm. There is no gastrohepatic or hepatoduodenal ligament lymphadenopathy. No intraperitoneal or retroperitoneal lymphadenopathy. No pelvic sidewall lymphadenopathy. Reproductive: Uterine fibroids, some of which are exophytic. There is no adnexal mass. Other: Small volume intraperitoneal free fluid. Musculoskeletal: No worrisome lytic or sclerotic osseous abnormality. Small umbilical hernia contains only fat. IMPRESSION: 1. Marked right hydronephrosis with overlying cortical thinning,  suggesting chronicity. Level of obstruction is at the UPJ and may be congenital although nonvisualized obstructing UPJ lesion not excluded. No urinary stone disease. Fairly substantial right perinephric edema/inflammation and superinfection of the right kidney is a concern. 2. Hepatic steatosis with hepatomegaly. 3. Uterine fibroids. 4. Small volume intraperitoneal free fluid. Electronically Signed   By: Kennith CenterEric  Mansell M.D.   On: 04/19/2018 15:35    Catarina Hartshornavid Sami Froh, DO  [facility] Pager [phone]  If 7PM-7AM, please contact night-coverage [email or web address] Password [email or web address] 04/20/2018, 10:05 AM   LOS: 1 day

## 2018-04-20 NOTE — Progress Notes (Signed)
Patient ID: Haley Burgess, female   DOB: 12-Jun-1976, 42 y.o.   MRN: [id number]  I have made chart rounds on [name] and will continue to follow.   Dr. Retta Dionesahlstedt will be in our [place] office on Tuesday and will round at that time if not discharged.   She is afebrile with no tachycardia but has progressive AKI and a soft BP with a markedly elevated lactate all consistent with a picture of urosepsis.   She had 4670ml/hr UOP from at last records output at 3am but subsequent output is not recorded yet.    I would continue foley drainage for now to maximize decompression of the right kidney.  If her condition doesn't improve, a f/u CT to assess degree of renal decompression would be indicate and if she is not draining well placement of a right percutaneous nephrostomy tube placement should be considered.

## 2018-04-21 ENCOUNTER — Encounter (HOSPITAL_COMMUNITY): Payer: Self-pay | Admitting: Urology

## 2018-04-21 ENCOUNTER — Inpatient Hospital Stay (HOSPITAL_COMMUNITY): Payer: Medicare HMO

## 2018-04-21 LAB — BASIC METABOLIC PANEL
ANION GAP: 8 (ref 5–15)
BUN: 40 mg/dL — ABNORMAL HIGH (ref 6–20)
CALCIUM: 7.8 mg/dL — AB (ref 8.9–10.3)
CHLORIDE: 109 mmol/L (ref 98–111)
CO2: 23 mmol/L (ref 22–32)
CREATININE: 1.9 mg/dL — AB (ref 0.44–1.00)
GFR calc Af Amer: 36 mL/min — ABNORMAL LOW (ref >60)
GFR calc non Af Amer: 31 mL/min — ABNORMAL LOW (ref >60)
Glucose, Bld: 115 mg/dL — ABNORMAL HIGH (ref 70–99)
Potassium: 4.2 mmol/L (ref 3.5–5.1)
SODIUM: 140 mmol/L (ref 135–145)

## 2018-04-21 LAB — GLUCOSE, CAPILLARY
GLUCOSE-CAPILLARY: 121 mg/dL — AB (ref 70–99)
GLUCOSE-CAPILLARY: 106 mg/dL — AB (ref 70–99)
GLUCOSE-CAPILLARY: 124 mg/dL — AB (ref 70–99)
GLUCOSE-CAPILLARY: 101 mg/dL — AB (ref 70–99)
Glucose-Capillary: 123 mg/dL — ABNORMAL HIGH (ref 70–99)
Glucose-Capillary: 129 mg/dL — ABNORMAL HIGH (ref 70–99)

## 2018-04-21 LAB — CBC
HCT: 34.5 % — ABNORMAL LOW (ref 36.0–46.0)
HEMOGLOBIN: 11.2 g/dL — AB (ref 12.0–15.0)
MCH: 31 pg (ref 26.0–34.0)
MCHC: 32.5 g/dL (ref 30.0–36.0)
MCV: 95.6 fL (ref 78.0–100.0)
Platelets: 173 10*3/uL (ref 150–400)
RBC: 3.61 MIL/uL — AB (ref 3.87–5.11)
RDW: 15.9 % — ABNORMAL HIGH (ref 11.5–15.5)
WBC: 6.9 10*3/uL (ref 4.0–10.5)

## 2018-04-21 LAB — URINE CULTURE: Culture: NO GROWTH

## 2018-04-21 MED ORDER — FLUTICASONE PROPIONATE 50 MCG/ACT NA SUSP
1.0000 | Freq: Every day | NASAL | Status: DC
  Administered 2018-04-21: 1 via NASAL

## 2018-04-21 NOTE — Progress Notes (Signed)
PROGRESS NOTE  Haley Burgess GNF:[id number] DOB: [birth year] DOA: 04/19/2018 PCP: Patient, No Pcp Per Brief History:  42 year old female with a history of diabetes mellitus, hypertension, COPD presenting with right flank pain with associated nausea and vomiting that started on 04/17/2018.  The patient went to [facility].  She was given a dose of intravenous antibiotics and discharged home with antibiotics and antiemetics and hydrocodone.  The patient continued to have right flank pain and nausea and vomiting.  She did not get her antibiotics filled.  Because of continued pain and vomiting, the patient represented to the ED on 04/19/2018.  The patient had subjective fevers and chills with some urinary urgency type symptoms.  She denied any diarrhea, hematochezia, melena, chest pain, shortness of breath.  In the emergency department, the patient was noted to have WBC 20.7 with lactic acid 4.02.  Serum creatinine was 2.75.  Urology was consulted.  The patient was taken to the operating room by Dr. Annabell Howells on the evening of 04/19/2018.  The patient underwent cystoscopy with right retrograde pyelogram and insertion of a right double-J stent.  Purulent reflux was noted after the stent was placed.  The patient was started on ceftriaxone.  Assessment/Plan: Sepsis -present at time of admission -lactic acid peaked 4.02 -continue IVF -continue IV abx -blood and urine cultures remain negative -pt continues to have fever up to 101.2  Pyelonephritis -Continue ceftriaxone  -Unfortunately, cultures may be compromised as the patient had received antibiotics during her ED visit at [facility]; in addition, urine cultures during this admission were sent after the patient received ceftriaxone -urine and blood cultures neg -Continue pain control -Continue IV fluids -04/19/2018 CT renal stone protocol--right hydronephrosis with associated cortical thinning; obstruction at the right UPJ with  right perinephric edema and inflammation -repeat CT abd/pelvis as pt continues to have fever  Acute kidney injury -Serum creatinine peaked to 3.08 -Multifactorial including obstructive uropathy and hemodynamic changes secondary to soft blood pressures -unclear renal baseline -Holding lisinopril  -repeat CT abd/pelvis -am BMP  Hypertension -Holding lisinopril and amlodipine secondary to soft blood pressure initially  Diabetes mellitus type 2 -Holding metformin -Hemoglobin A1c--6.3 -NovoLog sliding scale  COPD -Stable on room air -Continue Dulera  Hyperlipidemia -Continue statin   Disposition Plan:   Home in 2-3 days  Family Communication:   daughter updated at bedside 7/8--Total time spent 35 minutes.  Greater than 50% spent face to face counseling and coordinating care.   Consultants:  urology  Code Status:  FULL   DVT Prophylaxis:  SCDs   Procedures: As Listed in Progress Note Above  Antibiotics: Ceftriaxone 7/6>>>    Subjective: Pt continues to have fever.  She denies headache, cp, sob, n/v/d.  Right flank pain is improving.  No hematochezia or melena  Objective: Vitals:   04/21/18 0200 04/21/18 0606 04/21/18 0741 04/21/18 1312  BP:  128/77  (!) 149/68  Pulse:  78  90  Resp:  18  18  Temp: 98.9 F (37.2 C) 98.7 F (37.1 C)  (!) 101.2 F (38.4 C)  TempSrc: Oral Oral  Oral  SpO2:  100% 91% 96%  Weight:      Height:        Intake/Output Summary (Last 24 hours) at 04/21/2018 1504 Last data filed at 04/21/2018 1037 Gross per 24 hour  Intake 4407.08 ml  Output 5500 ml  Net -1092.92 ml   Weight change:  Exam:   General:  Pt is alert, follows commands appropriately, not in acute distress  HEENT: No icterus, No thrush, No neck mass, [place]/AT  Cardiovascular: RRR, S1/S2, no rubs, no gallops  Respiratory: bibasilar crackles, no wheeze  Abdomen: Soft/+BS, non tender, non distended, no guarding  Extremities: 1 + LE edema, No  lymphangitis, No petechiae, No rashes, no synovitis   Data Reviewed: I have personally reviewed following labs and imaging studies Basic Metabolic Panel: Recent Labs  Lab 04/19/18 1418 04/20/18 0658 04/21/18 0550  NA 136 137 140  K 5.0 4.2 4.2  CL 101 104 109  CO2 21* 23 23  GLUCOSE 168* 111* 115*  BUN 35* 46* 40*  CREATININE 2.75* 3.08* 1.90*  CALCIUM 8.4* 7.5* 7.8*   Liver Function Tests: Recent Labs  Lab 04/19/18 1418  AST 15  ALT 22  ALKPHOS 51  BILITOT 1.1  PROT 6.3*  ALBUMIN 3.1*   Recent Labs  Lab 04/19/18 1418  LIPASE 17   No results for input(s): AMMONIA in the last 168 hours. Coagulation Profile: No results for input(s): INR, PROTIME in the last 168 hours. CBC: Recent Labs  Lab 04/19/18 1418 04/20/18 0658 04/21/18 0550  WBC 20.7* 9.6 6.9  NEUTROABS 17.7*  --   --   HGB 13.5 11.3* 11.2*  HCT 39.9 35.3* 34.5*  MCV 94.3 95.4 95.6  PLT 191 185 173   Cardiac Enzymes: No results for input(s): CKTOTAL, CKMB, CKMBINDEX, TROPONINI in the last 168 hours. BNP: Invalid input(s): POCBNP CBG: Recent Labs  Lab 04/20/18 1120 04/20/18 1608 04/20/18 2206 04/21/18 0724 04/21/18 1109  GLUCAP 121* 109* 110* 106* 124*   HbA1C: Recent Labs    04/20/18 1045  HGBA1C 6.3*   Urine analysis:    Component Value Date/Time   COLORURINE AMBER (A) 04/19/2018 1407   APPEARANCEUR CLOUDY (A) 04/19/2018 1407   LABSPEC 1.024 04/19/2018 1407   PHURINE 5.0 04/19/2018 1407   GLUCOSEU NEGATIVE 04/19/2018 1407   HGBUR MODERATE (A) 04/19/2018 1407   BILIRUBINUR SMALL (A) 04/19/2018 1407   KETONESUR NEGATIVE 04/19/2018 1407   PROTEINUR 30 (A) 04/19/2018 1407   NITRITE NEGATIVE 04/19/2018 1407   LEUKOCYTESUR MODERATE (A) 04/19/2018 1407   Sepsis Labs: @LABRCNTIP (procalcitonin:4,lacticidven:4) ) Recent Results (from the past 240 hour(s))  Blood culture (routine x 2)     Status: None (Preliminary result)   Collection Time: 04/19/18  4:30 PM  Result Value Ref  Range Status   Specimen Description LEFT ANTECUBITAL  Final   Special Requests   Final    BOTTLES DRAWN AEROBIC AND ANAEROBIC Blood Culture adequate volume   Culture   Final    NO GROWTH 2 DAYS Performed at [facility], [address], [place], [place] [address]    Report Status PENDING  Incomplete  Blood culture (routine x 2)     Status: None (Preliminary result)   Collection Time: 04/19/18  4:55 PM  Result Value Ref Range Status   Specimen Description BLOOD LEFT ARM  Final   Special Requests   Final    BOTTLES DRAWN AEROBIC AND ANAEROBIC Blood Culture adequate volume   Culture   Final    NO GROWTH 2 DAYS Performed at [facility], [address], [place], [place] [address]    Report Status PENDING  Incomplete  Urine Culture     Status: None   Collection Time: 04/19/18  6:15 PM  Result Value Ref Range Status   Specimen Description   Final    CYSTOSCOPY Performed at [facility],  [address] [place], [place] [address]    Special Requests   Final    NONE Performed at [facility], [address] [place], [place] [address]    Culture   Final    NO GROWTH Performed at [facility], [address] [place], [place] [address]    Report Status 04/21/2018 FINAL  Final     Scheduled Meds: . fluticasone  1 spray Each Nare QHS  . gabapentin  300 mg Oral TID  . insulin aspart  0-9 Units Subcutaneous TID WC  . mometasone-formoterol  2 puff Inhalation BID  . polyethylene glycol  17 g Oral Daily  . pravastatin  40 mg Oral q1800  . senna  2 tablet Oral Daily   Continuous Infusions: . sodium chloride 1,000 mL (04/21/18 1037)  . cefTRIAXone (ROCEPHIN)  IV Stopped (04/20/18 1529)    Procedures/Studies: Dg Retrograde Pyelogram  Result Date: 04/19/2018 CLINICAL DATA:  Retrograde pyelogram. FLUOROSCOPY TIME:  52.3 seconds. Images: 4 EXAM: RETROGRADE PYELOGRAM COMPARISON:  CT scan April 19, 2018 FINDINGS: Four images were obtained. On the first, a wire projects over the lower  abdomen/upper pelvis. On the second, the wire extends into the right side of the abdomen. On the third, the wire has been pulled back slightly. On the fourth, the wire has been withdrawn. Limited imaging. IMPRESSION: Limited imaging obtained during retrograde pyelogram. I do not see that any contrast was injected into the collecting system. I do not see a stent in place at the end of the study. Electronically Signed   By: Gerome Samavid  Williams III M.D   On: 04/19/2018 19:21   Ct Renal Stone Study  Result Date: 04/19/2018 CLINICAL DATA:  Right-sided flank pain EXAM: CT ABDOMEN AND PELVIS WITHOUT CONTRAST TECHNIQUE: Multidetector CT imaging of the abdomen and pelvis was performed following the standard protocol without IV contrast. COMPARISON:  None. FINDINGS: Lower chest: Subsegmental atelectasis in the lung bases. Hepatobiliary: The liver measures 18.7 cm cranial caudal length. The liver shows diffusely decreased attenuation suggesting steatosis. Gallbladder is distended. No intrahepatic or extrahepatic biliary dilation. Pancreas: No focal mass lesion. No dilatation of the main duct. No intraparenchymal cyst. No peripancreatic edema. Spleen: No splenomegaly. No focal mass lesion. Adrenals/Urinary Tract: No adrenal nodule or mass. Marked right hydronephrosis is associated with overlying cortical thinning. No associated right hydroureter. No stone disease in the right kidney and no obstructing lesion is identified at the UPJ. 2.1 cm exophytic interpolar left renal lesion measures water attenuation and is most compatible with a cyst. Left kidney unremarkable.  Left ureter unremarkable. Bladder is decompressed. Stomach/Bowel: Stomach is nondistended. No gastric wall thickening. No evidence of outlet obstruction. Duodenum is normally positioned as is the ligament of Treitz. No small bowel wall thickening. No small bowel dilatation. The terminal ileum is normal. The appendix is normal. No gross colonic mass. No colonic wall  thickening. No substantial diverticular change. Vascular/Lymphatic: No abdominal aortic aneurysm. There is no gastrohepatic or hepatoduodenal ligament lymphadenopathy. No intraperitoneal or retroperitoneal lymphadenopathy. No pelvic sidewall lymphadenopathy. Reproductive: Uterine fibroids, some of which are exophytic. There is no adnexal mass. Other: Small volume intraperitoneal free fluid. Musculoskeletal: No worrisome lytic or sclerotic osseous abnormality. Small umbilical hernia contains only fat. IMPRESSION: 1. Marked right hydronephrosis with overlying cortical thinning, suggesting chronicity. Level of obstruction is at the UPJ and may be congenital although nonvisualized obstructing UPJ lesion not excluded. No urinary stone disease. Fairly substantial right perinephric edema/inflammation and superinfection of the right kidney is  a concern. 2. Hepatic steatosis with hepatomegaly. 3. Uterine fibroids. 4. Small volume intraperitoneal free fluid. Electronically Signed   By: Kennith Center M.D.   On: 04/19/2018 15:35    Catarina Hartshorn, DO  [facility] Pager [phone]  If 7PM-7AM, please contact night-coverage [email or web address] Password [email or web address] 04/21/2018, 3:04 PM   LOS: 2 days

## 2018-04-21 NOTE — Progress Notes (Signed)
Pt refused both Miralax and Senokot. States she had a large BM this morning, is passing gas and does not want to take either medication this morning. Pt encouraged to call RN if she changes her mind.

## 2018-04-21 NOTE — Progress Notes (Signed)
Pt's oral temp 101.2. PRN PO Tylenol given. Dr. Arbutus Leasat paged and made aware. Will continue to monitor.

## 2018-04-22 DIAGNOSIS — N1330 Unspecified hydronephrosis: Secondary | ICD-10-CM

## 2018-04-22 DIAGNOSIS — N12 Tubulo-interstitial nephritis, not specified as acute or chronic: Secondary | ICD-10-CM

## 2018-04-22 LAB — CBC
HCT: 32.8 % — ABNORMAL LOW (ref 36.0–46.0)
Hemoglobin: 10.4 g/dL — ABNORMAL LOW (ref 12.0–15.0)
MCH: 30.3 pg (ref 26.0–34.0)
MCHC: 31.7 g/dL (ref 30.0–36.0)
MCV: 95.6 fL (ref 78.0–100.0)
PLATELETS: 185 10*3/uL (ref 150–400)
RBC: 3.43 MIL/uL — ABNORMAL LOW (ref 3.87–5.11)
RDW: 16 % — ABNORMAL HIGH (ref 11.5–15.5)
WBC: 4.1 10*3/uL (ref 4.0–10.5)

## 2018-04-22 LAB — BASIC METABOLIC PANEL
Anion gap: 6 (ref 5–15)
BUN: 27 mg/dL — AB (ref 6–20)
CALCIUM: 7.8 mg/dL — AB (ref 8.9–10.3)
CO2: 24 mmol/L (ref 22–32)
CREATININE: 1.34 mg/dL — AB (ref 0.44–1.00)
Chloride: 110 mmol/L (ref 98–111)
GFR calc non Af Amer: 47 mL/min — ABNORMAL LOW (ref >60)
GFR, EST AFRICAN AMERICAN: 55 mL/min — AB (ref >60)
GLUCOSE: 114 mg/dL — AB (ref 70–99)
Potassium: 4.3 mmol/L (ref 3.5–5.1)
Sodium: 140 mmol/L (ref 135–145)

## 2018-04-22 LAB — GLUCOSE, CAPILLARY
GLUCOSE-CAPILLARY: 105 mg/dL — AB (ref 70–99)
Glucose-Capillary: 157 mg/dL — ABNORMAL HIGH (ref 70–99)

## 2018-04-22 MED ORDER — TRAMADOL HCL 50 MG PO TABS: 50.0000 mg | ORAL_TABLET | Freq: Four times a day (QID) | ORAL | 0 refills | Status: DC | PRN

## 2018-04-22 MED ORDER — AMLODIPINE BESYLATE 5 MG PO TABS: 5.0000 mg | ORAL_TABLET | Freq: Every day | ORAL | 1 refills | Status: DC

## 2018-04-22 MED ORDER — CIPROFLOXACIN HCL 500 MG PO TABS: 500.0000 mg | ORAL_TABLET | Freq: Two times a day (BID) | ORAL | 0 refills | Status: DC

## 2018-04-22 NOTE — Progress Notes (Signed)
3 Days Post-Op Subjective: Patient reports feeling better. Wants catheter out.  Objective: Vital signs in last 24 hours: Temp:  [99.4 F (37.4 C)-101.2 F (38.4 C)] 99.5 F (37.5 C) (07/09 0615) Pulse Rate:  [71-90] 71 (07/09 0615) Resp:  [18] 18 (07/08 1312) BP: (140-150)/(68-88) 140/68 (07/09 0615) SpO2:  [93 %-99 %] 93 % (07/09 0723)  Intake/Output from previous day: 07/08 0701 - 07/09 0700 In: 5776.7 [P.O.:960; I.V.:4716.7; IV Piggyback:100] Out: 4300 [Urine:4300] Intake/Output this shift: Total I/O In: 1098.3 [P.O.:240; I.V.:858.3] Out: 2050 [Urine:2050]  Physical Exam:  Constitutional: Vital signs reviewed. WD WN in NAD   Eyes: PERRL, No scleral icterus.   Cardiovascular: Reg rate Pulmonary/Chest: Normal effort Extremities: No cyanosis or edema   Lab Results: Recent Labs    04/20/18 0658 04/21/18 0550 04/22/18 0521  HGB 11.3* 11.2* 10.4*  HCT 35.3* 34.5* 32.8*   BMET Recent Labs    04/21/18 0550 04/22/18 0521  NA 140 140  K 4.2 4.3  CL 109 110  CO2 23 24  GLUCOSE 115* 114*  BUN 40* 27*  CREATININE 1.90* 1.34*  CALCIUM 7.8* 7.8*   No results for input(s): LABPT, INR in the last 72 hours. No results for input(s): LABURIN in the last 72 hours. Results for orders placed or performed during the hospital encounter of 04/19/18  Blood culture (routine x 2)     Status: None (Preliminary result)   Collection Time: 04/19/18  4:30 PM  Result Value Ref Range Status   Specimen Description LEFT ANTECUBITAL  Final   Special Requests   Final    BOTTLES DRAWN AEROBIC AND ANAEROBIC Blood Culture adequate volume   Culture   Final    NO GROWTH 3 DAYS Performed at [facility], [address] [place], [place] [address]    Report Status PENDING  Incomplete  Blood culture (routine x 2)     Status: None (Preliminary result)   Collection Time: 04/19/18  4:55 PM  Result Value Ref Range Status   Specimen Description BLOOD LEFT ARM  Final   Special Requests   Final    BOTTLES DRAWN AEROBIC AND ANAEROBIC Blood Culture adequate volume   Culture   Final    NO GROWTH 3 DAYS Performed at [facility], [address] [place], [place] [address]    Report Status PENDING  Incomplete  Urine Culture     Status: None   Collection Time: 04/19/18  6:15 PM  Result Value Ref Range Status   Specimen Description   Final    CYSTOSCOPY Performed at [facility], [address] [place], [place] [address]    Special Requests   Final    NONE Performed at [facility], [address] [place], [place] [address]    Culture   Final    NO GROWTH Performed at [facility], [address] [place], [place] [address]    Report Status 04/21/2018 FINAL  Final    Studies/Results: Ct Abdomen Pelvis Wo Contrast  Result Date: 04/21/2018 CLINICAL DATA:  42 year old female with history of fever. Kidney stones. Status post stent placement. EXAM: CT ABDOMEN AND PELVIS WITHOUT CONTRAST TECHNIQUE: Multidetector CT imaging of the abdomen and pelvis was performed following the standard protocol without IV contrast. COMPARISON:  CT the abdomen and pelvis 04/19/2018. FINDINGS: Lower chest: Trace right pleural effusion. Linear scarring in the lung bases bilaterally (right greater than left). Hepatobiliary: Diffuse low attenuation throughout the hepatic parenchyma, indicative of hepatic steatosis. No definite cystic or  solid hepatic lesions are confidently identified on today's noncontrast CT examination. Normal appendix. Pancreas: No pancreatic mass. No pancreatic ductal dilatation. No pancreatic or peripancreatic fluid or inflammatory changes. Spleen: Unremarkable. Adrenals/Urinary Tract: New right-sided double-J ureteral stent in position with distal loop reformed in the lumen of the urinary bladder and proximal loop reformed in the right renal pelvis. Gas in the right renal collecting system, presumably iatrogenic. Decompression of the previously dilated right renal collecting system is  noted. Previously noted right-sided perinephric stranding has decreased. Exophytic 2.1 cm low-attenuation lesion in the lower pole of the right kidney is incompletely characterize, but similar to the prior study, statistically likely a cyst. Left kidney and bilateral adrenal glands are normal in appearance. Urinary bladder is nearly completely decompressed with an indwelling Foley balloon catheter. Small amount of gas in the nondependent portion of the urinary bladder, presumably iatrogenic. Stomach/Bowel: Normal appearance of the stomach. No pathologic dilatation of small bowel or colon. Normal appendix. Vascular/Lymphatic: Minimal atherosclerotic calcifications noted in the pelvic vasculature. Several prominent borderline enlarged and mildly enlarged retroperitoneal lymph nodes are noted, largest of which is an aortocaval lymph node measuring 12 mm in short axis (axial image 40 of series 2), similar to the prior study, presumably chronic and reactive. Reproductive: In the posterior aspect of the lower uterine segment there is a 2.9 cm soft tissue attenuation lesion which likely represents a small fibroid. In the right adnexa (axial image 80 of series 2 and coronal image 67 of series 5) there is a 4.3 x 4.4 x 3.8 cm high attenuation (55 HU) lesion. Left adnexa is unremarkable in appearance. Other: No significant volume of ascites.  No pneumoperitoneum. Musculoskeletal: There are no aggressive appearing lytic or blastic lesions noted in the visualized portions of the skeleton. IMPRESSION: 1. Decompression of previously noted right UPJ obstruction following placement of right-sided double-J ureteral stent, as above. 2. High attenuation lesion associated with the right adnexa. It is uncertain whether or not this represents an exophytic fibroid from the right side of the uterine fundus, or is in fact an ovarian lesion. Further evaluation with nonemergent pelvic ultrasound is suggested in the near future to better  characterize this finding. 3. Hepatic steatosis. 4. Trace right pleural effusion with some passive subsegmental atelectasis in the right lower lobe. Electronically Signed   By: Trudie Reed M.D.   On: 04/21/2018 19:47    Assessment/Plan:   S/p stent placement for UPJ with pyonephrosis--doing better. OK w/ taking catheter out and d/c per hospital service. We will work on scheduling f/u.   LOS: 3 days   Chelsea Aus 04/22/2018, 1:12 PM

## 2018-04-22 NOTE — Discharge Summary (Signed)
Physician Discharge Summary  Haley Burgess ZOX:[id number] DOB: 1976/05/24 DOA: 04/19/2018  PCP: Patient, No Pcp Per  Admit date: 04/19/2018 Discharge date: 04/22/2018  Admitted From: Home Disposition:  Home  Recommendations for Outpatient Follow-up:  1. Follow up with PCP in 1-2 weeks 2. Please obtain BMP/CBC in one week 3. Please follow up on the following pending results:    Discharge Condition: Stable CODE STATUS:FULL Diet recommendation: Heart Healthy / Carb Modified    Brief/Interim Summary: 42 year old female with a history of diabetes mellitus, hypertension, COPD presenting with right flank pain with associated nausea and vomiting that started on 04/17/2018. The patient went to [facility] was given a dose of intravenous antibiotics and discharged home with antibiotics and antiemetics and hydrocodone. The patient continued to have right flank pain and nausea and vomiting. She did not get her antibiotics filled. Because of continued pain and vomiting, the patient represented to the ED on 04/19/2018. The patient had subjective fevers and chills with some urinary urgency type symptoms. She denied any diarrhea, hematochezia, melena, chest pain, shortness of breath. In the emergency department, the patient was noted to have WBC 20.7 with lactic acid 4.02. Serum creatinine was 2.75. Urology was consulted. The patient was taken to the operating room by Dr. Clint Bolder the evening of 04/19/2018. The patient underwent cystoscopy with right retrograde pyelogram and insertion of a right double-J stent. Purulent reflux was noted after the stent was placed. The patient was started on ceftriaxone with clinical improvement.  Unfortunately, the patient's cultures remain negative as the patient had received antibiotics during her ED visit in [place].  Patient was discharged with empiric ciprofloxacin for 10 additional days to complete 2 weeks of therapy.  The patient will need to  follow-up with urology in the outpatient setting.  They continue to follow the patient during the hospitalization.    Discharge Diagnoses:  Sepsis -present at time of admission -lactic acid peaked 4.02 -continued IVF -continued IV abx -blood and urine cultures remain negative -pt continues to have fever up to 101.2 -sepsis physiology resolved  Pyelonephritis -Continue ceftriaxone>>>home with cipro x 10 days -Unfortunately, cultures may be compromised as the patient had received antibiotics during her ED visit at [facility];in addition, urine cultures during this admission were sent after the patient received ceftriaxone -urine and blood cultures neg -Continue pain control -Continue IV fluids -04/19/2018 CT renal stone protocol--right hydronephrosis with associated cortical thinning;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  Acute kidney injury -Serum creatinine peaked to 3.08 -Multifactorial including obstructive uropathy and hemodynamic changes secondary to soft blood pressures -serum creatinine 1.34 on day of d/c -Holding lisinopril--will not restart due to aki -repeat CT abd/pelvis-as discussed above -recheck BMP one week after d/c  Hypertension -Holding lisinopril and amlodipine secondary to soft blood pressure initially -restart amlodipine -will not restart lisinopril due to AKI -follow up PCP for BP check  Diabetes mellitus type 2 -Holding metformin--restart after d/c -Hemoglobin A1c--6.3 -NovoLog sliding scale  COPD -Stable on room air -Continue Dulera  Hyperlipidemia -Continue statin     Discharge Instructions   Allergies as of 04/22/2018      Reactions   Norco [hydrocodone-acetaminophen] Nausea And Vomiting   Latex Rash      Medication List    STOP taking these  medications   lisinopril 10 MG tablet Commonly known as:  PRINIVIL,ZESTRIL   lovastatin  40 MG tablet Commonly known as:  MEVACOR     TAKE these medications   amLODipine 5 MG tablet Commonly known as:  NORVASC Take 1 tablet (5 mg total) by mouth daily.   budesonide-formoterol 160-4.5 MCG/ACT inhaler Commonly known as:  SYMBICORT Inhale 2 puffs into the lungs 2 (two) times daily.   ciprofloxacin 500 MG tablet Commonly known as:  CIPRO Take 1 tablet (500 mg total) by mouth 2 (two) times daily.   fluticasone 50 MCG/ACT nasal spray Commonly known as:  FLONASE Place 1 spray into both nostrils daily.   furosemide 40 MG tablet Commonly known as:  LASIX Take 40 mg by mouth daily as needed for fluid or edema.   gabapentin 300 MG capsule Commonly known as:  NEURONTIN Take 300 mg by mouth 3 (three) times daily.   hydrOXYzine 25 MG tablet Commonly known as:  ATARAX/VISTARIL Take 25 mg by mouth at bedtime as needed for anxiety.   Melatonin 10 MG Tabs Take 10 mg by mouth at bedtime.   metFORMIN 1000 MG tablet Commonly known as:  GLUCOPHAGE Take 1,000 mg by mouth 2 (two) times daily with a meal.   OMERA 1000 MG Caps Take 1 capsule by mouth 2 (two) times daily.   silver sulfADIAZINE 1 % cream Commonly known as:  SILVADENE APPLY TO THE AFFECTED AREA(S) ONCE DAILY FOR 20 DAYS   traMADol 50 MG tablet Commonly known as:  ULTRAM Take 1 tablet (50 mg total) by mouth every 6 (six) hours as needed for moderate pain.   Vitamin D 2000 units Caps Take 1 capsule by mouth every evening.      Follow-up Information    Bjorn Pippin, MD Follow up.   Specialty:  Urology Why:  call for a f/u appointment for 2-3 weeks.  Contact information: [address] [place] [address] [phone]          Allergies  Allergen Reactions  . Norco [Hydrocodone-Acetaminophen] Nausea And Vomiting  . Latex Rash    Consultations: urology   Procedures/Studies: Ct Abdomen Pelvis  Wo Contrast  Result Date: 04/21/2018 CLINICAL DATA:  42 year old female with history of fever. Kidney stones. Status post stent placement. EXAM: CT ABDOMEN AND PELVIS WITHOUT CONTRAST TECHNIQUE: Multidetector CT imaging of the abdomen and pelvis was performed following the standard protocol without IV contrast. COMPARISON:  CT the abdomen and pelvis 04/19/2018. FINDINGS: Lower chest: Trace right pleural effusion. Linear scarring in the lung bases bilaterally (right greater than left). Hepatobiliary: Diffuse low attenuation throughout the hepatic parenchyma, indicative of hepatic steatosis. No definite cystic or solid hepatic lesions are confidently identified on today's noncontrast CT examination. Normal appendix. Pancreas: No pancreatic mass. No pancreatic ductal dilatation. No pancreatic or peripancreatic fluid or inflammatory changes. Spleen: Unremarkable. Adrenals/Urinary Tract: New right-sided double-J ureteral stent in position with distal loop reformed in the lumen of the urinary bladder and proximal loop reformed in the right renal pelvis. Gas in the right renal collecting system, presumably iatrogenic. Decompression of the previously dilated right renal collecting system is noted. Previously noted right-sided perinephric stranding has decreased. Exophytic 2.1 cm low-attenuation lesion in the lower pole of the right kidney is incompletely characterize, but similar to the prior study, statistically likely a cyst. Left kidney and bilateral adrenal glands are normal in appearance. Urinary bladder is nearly completely decompressed with an indwelling Foley balloon catheter. Small amount of gas in the nondependent portion of the urinary bladder, presumably iatrogenic. Stomach/Bowel: Normal appearance of the stomach.  No pathologic dilatation of small bowel or colon. Normal appendix. Vascular/Lymphatic: Minimal atherosclerotic calcifications noted in the pelvic vasculature. Several prominent borderline enlarged and  mildly enlarged retroperitoneal lymph nodes are noted, largest of which is an aortocaval lymph node measuring 12 mm in short axis (axial image 40 of series 2), similar to the prior study, presumably chronic and reactive. Reproductive: In the posterior aspect of the lower uterine segment there is a 2.9 cm soft tissue attenuation lesion which likely represents a small fibroid. In the right adnexa (axial image 80 of series 2 and coronal image 67 of series 5) there is a 4.3 x 4.4 x 3.8 cm high attenuation (55 HU) lesion. Left adnexa is unremarkable in appearance. Other: No significant volume of ascites.  No pneumoperitoneum. Musculoskeletal: There are no aggressive appearing lytic or blastic lesions noted in the visualized portions of the skeleton. IMPRESSION: 1. Decompression of previously noted right UPJ obstruction following placement of right-sided double-J ureteral stent, as above. 2. High attenuation lesion associated with the right adnexa. It is uncertain whether or not this represents an exophytic fibroid from the right side of the uterine fundus, or is in fact an ovarian lesion. Further evaluation with nonemergent pelvic ultrasound is suggested in the near future to better characterize this finding. 3. Hepatic steatosis. 4. Trace right pleural effusion with some passive subsegmental atelectasis in the right lower lobe. Electronically Signed   By: Trudie Reedaniel  Entrikin M.D.   On: 04/21/2018 19:47   Dg Retrograde Pyelogram  Result Date: 04/19/2018 CLINICAL DATA:  Retrograde pyelogram. FLUOROSCOPY TIME:  52.3 seconds. Images: 4 EXAM: RETROGRADE PYELOGRAM COMPARISON:  CT scan April 19, 2018 FINDINGS: Four images were obtained. On the first, a wire projects over the lower abdomen/upper pelvis. On the second, the wire extends into the right side of the abdomen. On the third, the wire has been pulled back slightly. On the fourth, the wire has been withdrawn. Limited imaging. IMPRESSION: Limited imaging obtained during  retrograde pyelogram. I do not see that any contrast was injected into the collecting system. I do not see a stent in place at the end of the study. Electronically Signed   By: Gerome Samavid  Williams III M.D   On: 04/19/2018 19:21   Ct Renal Stone Study  Result Date: 04/19/2018 CLINICAL DATA:  Right-sided flank pain EXAM: CT ABDOMEN AND PELVIS WITHOUT CONTRAST TECHNIQUE: Multidetector CT imaging of the abdomen and pelvis was performed following the standard protocol without IV contrast. COMPARISON:  None. FINDINGS: Lower chest: Subsegmental atelectasis in the lung bases. Hepatobiliary: The liver measures 18.7 cm cranial caudal length. The liver shows diffusely decreased attenuation suggesting steatosis. Gallbladder is distended. No intrahepatic or extrahepatic biliary dilation. Pancreas: No focal mass lesion. No dilatation of the main duct. No intraparenchymal cyst. No peripancreatic edema. Spleen: No splenomegaly. No focal mass lesion. Adrenals/Urinary Tract: No adrenal nodule or mass. Marked right hydronephrosis is associated with overlying cortical thinning. No associated right hydroureter. No stone disease in the right kidney and no obstructing lesion is identified at the UPJ. 2.1 cm exophytic interpolar left renal lesion measures water attenuation and is most compatible with a cyst. Left kidney unremarkable.  Left ureter unremarkable. Bladder is decompressed. Stomach/Bowel: Stomach is nondistended. No gastric wall thickening. No evidence of outlet obstruction. Duodenum is normally positioned as is the ligament of Treitz. No small bowel wall thickening. No small bowel dilatation. The terminal ileum is normal. The appendix is normal. No gross colonic mass. No colonic wall thickening. No substantial diverticular  change. Vascular/Lymphatic: No abdominal aortic aneurysm. There is no gastrohepatic or hepatoduodenal ligament lymphadenopathy. No intraperitoneal or retroperitoneal lymphadenopathy. No pelvic sidewall  lymphadenopathy. Reproductive: Uterine fibroids, some of which are exophytic. There is no adnexal mass. Other: Small volume intraperitoneal free fluid. Musculoskeletal: No worrisome lytic or sclerotic osseous abnormality. Small umbilical hernia contains only fat. IMPRESSION: 1. Marked right hydronephrosis with overlying cortical thinning, suggesting chronicity. Level of obstruction is at the UPJ and may be congenital although nonvisualized obstructing UPJ lesion not excluded. No urinary stone disease. Fairly substantial right perinephric edema/inflammation and superinfection of the right kidney is a concern. 2. Hepatic steatosis with hepatomegaly. 3. Uterine fibroids. 4. Small volume intraperitoneal free fluid. Electronically Signed   By: Kennith Center M.D.   On: 04/19/2018 15:35        Discharge Exam: Vitals:   04/22/18 0723 04/22/18 1504  BP:  (!) 156/82  Pulse:  85  Resp:  (!) 22  Temp:  99 F (37.2 C)  SpO2: 93% 96%   Vitals:   04/21/18 2128 04/22/18 0615 04/22/18 0723 04/22/18 1504  BP: (!) 150/88 140/68  (!) 156/82  Pulse: 81 71  85  Resp:    (!) 22  Temp: 99.4 F (37.4 C) 99.5 F (37.5 C)  99 F (37.2 C)  TempSrc: Oral Oral  Oral  SpO2: 99% 95% 93% 96%  Weight:      Height:        General: Pt is alert, awake, not in acute distress Cardiovascular: RRR, S1/S2 +, no rubs, no gallops Respiratory: CTA bilaterally, no wheezing, no rhonchi Abdominal: Soft, NT, ND, bowel sounds + Extremities: no edema, no cyanosis   The results of significant diagnostics from this hospitalization (including imaging, microbiology, ancillary and laboratory) are listed below for reference.    Significant Diagnostic Studies: Ct Abdomen Pelvis Wo Contrast  Result Date: 04/21/2018 CLINICAL DATA:  42 year old female with history of fever. Kidney stones. Status post stent placement. EXAM: CT ABDOMEN AND PELVIS WITHOUT CONTRAST TECHNIQUE: Multidetector CT imaging of the abdomen and pelvis was  performed following the standard protocol without IV contrast. COMPARISON:  CT the abdomen and pelvis 04/19/2018. FINDINGS: Lower chest: Trace right pleural effusion. Linear scarring in the lung bases bilaterally (right greater than left). Hepatobiliary: Diffuse low attenuation throughout the hepatic parenchyma, indicative of hepatic steatosis. No definite cystic or solid hepatic lesions are confidently identified on today's noncontrast CT examination. Normal appendix. Pancreas: No pancreatic mass. No pancreatic ductal dilatation. No pancreatic or peripancreatic fluid or inflammatory changes. Spleen: Unremarkable. Adrenals/Urinary Tract: New right-sided double-J ureteral stent in position with distal loop reformed in the lumen of the urinary bladder and proximal loop reformed in the right renal pelvis. Gas in the right renal collecting system, presumably iatrogenic. Decompression of the previously dilated right renal collecting system is noted. Previously noted right-sided perinephric stranding has decreased. Exophytic 2.1 cm low-attenuation lesion in the lower pole of the right kidney is incompletely characterize, but similar to the prior study, statistically likely a cyst. Left kidney and bilateral adrenal glands are normal in appearance. Urinary bladder is nearly completely decompressed with an indwelling Foley balloon catheter. Small amount of gas in the nondependent portion of the urinary bladder, presumably iatrogenic. Stomach/Bowel: Normal appearance of the stomach. No pathologic dilatation of small bowel or colon. Normal appendix. Vascular/Lymphatic: Minimal atherosclerotic calcifications noted in the pelvic vasculature. Several prominent borderline enlarged and mildly enlarged retroperitoneal lymph nodes are noted, largest of which is an aortocaval lymph node measuring 12  mm in short axis (axial image 40 of series 2), similar to the prior study, presumably chronic and reactive. Reproductive: In the  posterior aspect of the lower uterine segment there is a 2.9 cm soft tissue attenuation lesion which likely represents a small fibroid. In the right adnexa (axial image 80 of series 2 and coronal image 67 of series 5) there is a 4.3 x 4.4 x 3.8 cm high attenuation (55 HU) lesion. Left adnexa is unremarkable in appearance. Other: No significant volume of ascites.  No pneumoperitoneum. Musculoskeletal: There are no aggressive appearing lytic or blastic lesions noted in the visualized portions of the skeleton. IMPRESSION: 1. Decompression of previously noted right UPJ obstruction following placement of right-sided double-J ureteral stent, as above. 2. High attenuation lesion associated with the right adnexa. It is uncertain whether or not this represents an exophytic fibroid from the right side of the uterine fundus, or is in fact an ovarian lesion. Further evaluation with nonemergent pelvic ultrasound is suggested in the near future to better characterize this finding. 3. Hepatic steatosis. 4. Trace right pleural effusion with some passive subsegmental atelectasis in the right lower lobe. Electronically Signed   By: Trudie Reed M.D.   On: 04/21/2018 19:47   Dg Retrograde Pyelogram  Result Date: 04/19/2018 CLINICAL DATA:  Retrograde pyelogram. FLUOROSCOPY TIME:  52.3 seconds. Images: 4 EXAM: RETROGRADE PYELOGRAM COMPARISON:  CT scan April 19, 2018 FINDINGS: Four images were obtained. On the first, a wire projects over the lower abdomen/upper pelvis. On the second, the wire extends into the right side of the abdomen. On the third, the wire has been pulled back slightly. On the fourth, the wire has been withdrawn. Limited imaging. IMPRESSION: Limited imaging obtained during retrograde pyelogram. I do not see that any contrast was injected into the collecting system. I do not see a stent in place at the end of the study. Electronically Signed   By: Gerome Sam III M.D   On: 04/19/2018 19:21   Ct Renal Stone  Study  Result Date: 04/19/2018 CLINICAL DATA:  Right-sided flank pain EXAM: CT ABDOMEN AND PELVIS WITHOUT CONTRAST TECHNIQUE: Multidetector CT imaging of the abdomen and pelvis was performed following the standard protocol without IV contrast. COMPARISON:  None. FINDINGS: Lower chest: Subsegmental atelectasis in the lung bases. Hepatobiliary: The liver measures 18.7 cm cranial caudal length. The liver shows diffusely decreased attenuation suggesting steatosis. Gallbladder is distended. No intrahepatic or extrahepatic biliary dilation. Pancreas: No focal mass lesion. No dilatation of the main duct. No intraparenchymal cyst. No peripancreatic edema. Spleen: No splenomegaly. No focal mass lesion. Adrenals/Urinary Tract: No adrenal nodule or mass. Marked right hydronephrosis is associated with overlying cortical thinning. No associated right hydroureter. No stone disease in the right kidney and no obstructing lesion is identified at the UPJ. 2.1 cm exophytic interpolar left renal lesion measures water attenuation and is most compatible with a cyst. Left kidney unremarkable.  Left ureter unremarkable. Bladder is decompressed. Stomach/Bowel: Stomach is nondistended. No gastric wall thickening. No evidence of outlet obstruction. Duodenum is normally positioned as is the ligament of Treitz. No small bowel wall thickening. No small bowel dilatation. The terminal ileum is normal. The appendix is normal. No gross colonic mass. No colonic wall thickening. No substantial diverticular change. Vascular/Lymphatic: No abdominal aortic aneurysm. There is no gastrohepatic or hepatoduodenal ligament lymphadenopathy. No intraperitoneal or retroperitoneal lymphadenopathy. No pelvic sidewall lymphadenopathy. Reproductive: Uterine fibroids, some of which are exophytic. There is no adnexal mass. Other: Small volume intraperitoneal free  fluid. Musculoskeletal: No worrisome lytic or sclerotic osseous abnormality. Small umbilical hernia  contains only fat. IMPRESSION: 1. Marked right hydronephrosis with overlying cortical thinning, suggesting chronicity. Level of obstruction is at the UPJ and may be congenital although nonvisualized obstructing UPJ lesion not excluded. No urinary stone disease. Fairly substantial right perinephric edema/inflammation and superinfection of the right kidney is a concern. 2. Hepatic steatosis with hepatomegaly. 3. Uterine fibroids. 4. Small volume intraperitoneal free fluid. Electronically Signed   By: Kennith Center M.D.   On: 04/19/2018 15:35     Microbiology: Recent Results (from the past 240 hour(s))  Blood culture (routine x 2)     Status: None (Preliminary result)   Collection Time: 04/19/18  4:30 PM  Result Value Ref Range Status   Specimen Description LEFT ANTECUBITAL  Final   Special Requests   Final    BOTTLES DRAWN AEROBIC AND ANAEROBIC Blood Culture adequate volume   Culture   Final    NO GROWTH 3 DAYS Performed at [facility], [address], [place], [place] [address]    Report Status PENDING  Incomplete  Blood culture (routine x 2)     Status: None (Preliminary result)   Collection Time: 04/19/18  4:55 PM  Result Value Ref Range Status   Specimen Description BLOOD LEFT ARM  Final   Special Requests   Final    BOTTLES DRAWN AEROBIC AND ANAEROBIC Blood Culture adequate volume   Culture   Final    NO GROWTH 3 DAYS Performed at [facility], [address] [place], [place] [address]    Report Status PENDING  Incomplete  Urine Culture     Status: None   Collection Time: 04/19/18  6:15 PM  Result Value Ref Range Status   Specimen Description   Final    CYSTOSCOPY Performed at [facility], [address], [place], [place] [address]    Special Requests   Final    NONE Performed at [facility], [address] [place], [place] [address]    Culture   Final    NO GROWTH Performed at [facility], [address], [place], [place] [address]    Report Status 04/21/2018  FINAL  Final     Labs: Basic Metabolic Panel: Recent Labs  Lab 04/19/18 1418 04/20/18 0658 04/21/18 0550 04/22/18 0521  NA 136 137 140 140  K 5.0 4.2 4.2 4.3  CL 101 104 109 110  CO2 21* 23 23 24   GLUCOSE 168* 111* 115* 114*  BUN 35* 46* 40* 27*  CREATININE 2.75* 3.08* 1.90* 1.34*  CALCIUM 8.4* 7.5* 7.8* 7.8*   Liver Function Tests: Recent Labs  Lab 04/19/18 1418  AST 15  ALT 22  ALKPHOS 51  BILITOT 1.1  PROT 6.3*  ALBUMIN 3.1*   Recent Labs  Lab 04/19/18 1418  LIPASE 17   No results for input(s): AMMONIA in the last 168 hours. CBC: Recent Labs  Lab 04/19/18 1418 04/20/18 0658 04/21/18 0550 04/22/18 0521  WBC 20.7* 9.6 6.9 4.1  NEUTROABS 17.7*  --   --   --   HGB 13.5 11.3* 11.2* 10.4*  HCT 39.9 35.3* 34.5* 32.8*  MCV 94.3 95.4 95.6 95.6  PLT 191 185 173 185   Cardiac Enzymes: No results for input(s): CKTOTAL, CKMB, CKMBINDEX, TROPONINI in the last 168 hours. BNP: Invalid input(s): POCBNP CBG: Recent Labs  Lab 04/21/18 1109 04/21/18 1612 04/21/18 2129 04/22/18 0746 04/22/18 1116  GLUCAP 124* 129* 101* 105* 157*  Time coordinating discharge:  36 minutes  Signed:  Catarina Hartshorn, DO [facility] Pager: [phone] 04/22/2018, 3:39 PM

## 2018-04-22 NOTE — Care Management Important Message (Signed)
Important Message  Patient Details  Name: Haley Burgess MRN: [id number] Date of Birth: 19-Oct-1975   Medicare Important Message Given:  Yes    Ladena Jacquez, Chrystine OilerSharley Diane, RN 04/22/2018, 2:58 PM

## 2018-04-22 NOTE — Addendum Note (Signed)
Addendum  created 04/22/18 1751 by Franco NonesYates, Chinenye Katzenberger S, CRNA   Charge Capture section accepted

## 2018-04-22 NOTE — Progress Notes (Signed)
Pt discharged home today per MD. Pt's IV site D/C'd and WDL. Pt's VSS. Pt provided with home medication list, discharge instructions and prescriptions. Verbalized understanding. Pt left floor via WC in stable condition accompanied by NT. 

## 2018-04-24 LAB — CULTURE, BLOOD (ROUTINE X 2)
CULTURE: NO GROWTH
CULTURE: NO GROWTH
Special Requests: ADEQUATE
Special Requests: ADEQUATE

## 2018-05-13 ENCOUNTER — Other Ambulatory Visit: Payer: Self-pay | Admitting: Urology

## 2018-05-13 ENCOUNTER — Ambulatory Visit (INDEPENDENT_AMBULATORY_CARE_PROVIDER_SITE_OTHER): Payer: Medicare HMO | Admitting: Urology

## 2018-05-13 ENCOUNTER — Other Ambulatory Visit (HOSPITAL_COMMUNITY)
Admission: RE | Admit: 2018-05-13 | Discharge: 2018-05-13 | Disposition: A | Discharge status: Home / Self Care | Payer: Medicare HMO | Source: Other Acute Inpatient Hospital | Attending: Urology | Admitting: Urology

## 2018-05-13 DIAGNOSIS — N131 Hydronephrosis with ureteral stricture, not elsewhere classified: Secondary | ICD-10-CM

## 2018-05-14 LAB — URINE CULTURE: Culture: <10000 — AB

## 2018-05-16 ENCOUNTER — Encounter (HOSPITAL_COMMUNITY): Payer: Self-pay

## 2018-05-16 ENCOUNTER — Encounter (HOSPITAL_COMMUNITY)
Admission: RE | Admit: 2018-05-16 | Discharge: 2018-05-16 | Disposition: A | Discharge status: Home / Self Care | Payer: Medicare HMO | Source: Ambulatory Visit | Attending: Urology | Admitting: Urology

## 2018-05-16 DIAGNOSIS — N2889 Other specified disorders of kidney and ureter: Secondary | ICD-10-CM | POA: Insufficient documentation

## 2018-05-16 DIAGNOSIS — N135 Crossing vessel and stricture of ureter without hydronephrosis: Secondary | ICD-10-CM | POA: Diagnosis present

## 2018-05-16 DIAGNOSIS — N131 Hydronephrosis with ureteral stricture, not elsewhere classified: Secondary | ICD-10-CM

## 2018-05-16 MED ORDER — TECHNETIUM TC 99M MERTIATIDE
5.0000 | Freq: Once | INTRAVENOUS | Status: AC | PRN
  Administered 2018-05-16: 5 via INTRAVENOUS

## 2018-05-16 MED ORDER — FUROSEMIDE 10 MG/ML IJ SOLN
INTRAMUSCULAR | Status: AC
  Administered 2018-05-16: 60 mg via INTRAVENOUS
  Filled 2018-05-16: qty 6

## 2018-05-16 MED ORDER — FUROSEMIDE 10 MG/ML IJ SOLN
60.0000 mg | Freq: Once | INTRAMUSCULAR | Status: AC
  Administered 2018-05-16: 60 mg via INTRAVENOUS

## 2018-06-18 ENCOUNTER — Ambulatory Visit: Payer: Medicare HMO | Admitting: Urology

## 2018-06-18 DIAGNOSIS — N131 Hydronephrosis with ureteral stricture, not elsewhere classified: Secondary | ICD-10-CM | POA: Diagnosis not present

## 2018-06-18 DIAGNOSIS — Q6211 Congenital occlusion of ureteropelvic junction: Secondary | ICD-10-CM

## 2018-06-26 ENCOUNTER — Other Ambulatory Visit: Payer: Self-pay | Admitting: Urology

## 2018-07-18 ENCOUNTER — Encounter (HOSPITAL_COMMUNITY)
Admission: RE | Admit: 2018-07-18 | Discharge: 2018-07-18 | Disposition: A | Discharge status: Home / Self Care | Payer: Medicare HMO | Source: Ambulatory Visit | Attending: Urology | Admitting: Urology

## 2018-07-18 ENCOUNTER — Encounter (HOSPITAL_COMMUNITY): Payer: Self-pay | Admitting: *Deleted

## 2018-07-18 ENCOUNTER — Other Ambulatory Visit: Payer: Self-pay

## 2018-07-18 DIAGNOSIS — Z01812 Encounter for preprocedural laboratory examination: Secondary | ICD-10-CM | POA: Diagnosis present

## 2018-07-18 DIAGNOSIS — N289 Disorder of kidney and ureter, unspecified: Secondary | ICD-10-CM | POA: Diagnosis not present

## 2018-07-18 HISTORY — DX: Urgency of urination: R39.15

## 2018-07-18 HISTORY — DX: Type 2 diabetes mellitus with diabetic neuropathy, unspecified: E11.40

## 2018-07-18 HISTORY — DX: Dependence on other enabling machines and devices: Z99.89

## 2018-07-18 HISTORY — DX: Presence of dental prosthetic device (complete) (partial): Z97.2

## 2018-07-18 HISTORY — DX: Type 2 diabetes mellitus without complications: E11.9

## 2018-07-18 HISTORY — DX: Frequency of micturition: R35.0

## 2018-07-18 HISTORY — DX: Body Mass Index (BMI) 40.0 and over, adult: Z684

## 2018-07-18 HISTORY — DX: Dysuria: R30.0

## 2018-07-18 HISTORY — DX: Personal history of other infectious and parasitic diseases: Z86.19

## 2018-07-18 HISTORY — DX: Dyspnea, unspecified: R06.00

## 2018-07-18 HISTORY — DX: Presence of spectacles and contact lenses: Z97.3

## 2018-07-18 HISTORY — DX: Disorder of kidney and ureter, unspecified: N28.9

## 2018-07-18 HISTORY — DX: Anemia, unspecified: D64.9

## 2018-07-18 HISTORY — DX: Obstructive sleep apnea (adult) (pediatric): G47.33

## 2018-07-18 HISTORY — DX: Endocrine disorder, unspecified: E34.9

## 2018-07-18 HISTORY — DX: Other forms of dyspnea: R06.09

## 2018-07-18 LAB — BASIC METABOLIC PANEL
Anion gap: 8 (ref 5–15)
BUN: 16 mg/dL (ref 6–20)
CALCIUM: 9.2 mg/dL (ref 8.9–10.3)
CO2: 27 mmol/L (ref 22–32)
CREATININE: 0.89 mg/dL (ref 0.44–1.00)
Chloride: 112 mmol/L — ABNORMAL HIGH (ref 98–111)
GFR calc Af Amer: >60 mL/min (ref >60)
GLUCOSE: 175 mg/dL — AB (ref 70–99)
Potassium: 3.8 mmol/L (ref 3.5–5.1)
SODIUM: 147 mmol/L — AB (ref 135–145)

## 2018-07-18 LAB — CBC
HCT: 40.7 % (ref 36.0–46.0)
Hemoglobin: 13.4 g/dL (ref 12.0–15.0)
MCH: 30 pg (ref 26.0–34.0)
MCHC: 32.9 g/dL (ref 30.0–36.0)
MCV: 91.3 fL (ref 78.0–100.0)
PLATELETS: 227 10*3/uL (ref 150–400)
RBC: 4.46 MIL/uL (ref 3.87–5.11)
RDW: 14.8 % (ref 11.5–15.5)
WBC: 9.3 10*3/uL (ref 4.0–10.5)

## 2018-07-18 LAB — GLUCOSE, CAPILLARY: Glucose-Capillary: 194 mg/dL — ABNORMAL HIGH (ref 70–99)

## 2018-07-18 MED ORDER — MAGNESIUM CITRATE PO SOLN
1.0000 | Freq: Once | ORAL | Status: DC
  Filled 2018-07-18: qty 296

## 2018-07-18 NOTE — Progress Notes (Signed)
EKG dated 04-19-2018 in epic.

## 2018-07-18 NOTE — Patient Instructions (Addendum)
Haley Burgess  05/08/1976    Your procedure is scheduled on:   07-25-2018    Report to [facility] Main  Entrance,  Report to admitting at  5:30 AM    Call this number if you have problems the morning of surgery [phone]     Remember: Do not eat food or drink liquids :After Midnight.                    BRUSH YOUR TEETH MORNING OF SURGERY AND RINSE YOUR MOUTH OUT, NO CHEWING GUM CANDY OR MINTS.     Take these medicines the morning of surgery with A SIP OF WATER:   Gabapentin, Symbicort inhaler (and bring symbicort and albuterol inhalers with you day of surgery)                    DO NOT TAKE ANY DIABETIC MEDICATIONS DAY OF YOUR SURGERY                                    BRING CPAP MASK AND TUBING DAY OF SURGERY               You may not have any metal on your body including hair pins and              piercings               Do not wear jewelry, make-up, lotions, powders or perfumes, deodorant                         Do not wear nail polish.                Do not shave  48 hours prior to surgery.                 Do not bring valuables to the hospital. [facility] IS NOT             RESPONSIBLE   FOR VALUABLES.  Contacts, dentures or bridgework may not be worn into surgery.  Leave suitcase in the car. After surgery it may be brought to your room.     Special Instructions:   Magnesium Citrate , one bottle, take 12 noon the day before surgery .  Start clear liquid diet day before surgery.  CLEAR LIQUID DIET   Foods Allowed                                                                     Foods Excluded  Coffee and tea, regular and decaf                             liquids that you cannot  Plain Jell-O in any flavor                                             see  through such as: Fruit ices (not with fruit pulp)                                     milk, soups, orange juice  Iced Popsicles                                    All solid food Carbonated beverages, regular and diet                                    Cranberry, grape and apple juices Sports drinks like Gatorade Lightly seasoned clear broth or consume(fat free) Sugar, honey syrup  Sample Menu Breakfast                                Lunch                                     Supper Cranberry juice                    Beef broth                            Chicken broth Jell-O                                     Grape juice                           Apple juice Coffee or tea                        Jell-O                                      Popsicle  Coffee or tea                        Coffee or tea  _____________________________________________________________________              [facility] - Preparing for Surgery Before surgery, you can play an important role.  Because skin is not sterile, your skin needs to be as free of germs as possible.  You can reduce the number of germs on your skin by washing with CHG (chlorahexidine gluconate) soap before surgery.  CHG is an antiseptic cleaner which kills germs and bonds with the skin to continue killing germs even after washing. Please DO NOT use if you have an allergy to CHG or antibacterial soaps.  If your skin becomes reddened/irritated stop using the CHG and inform your nurse when you arrive at Short Stay. Do not shave (including legs and underarms) for at least 48 hours prior to the first CHG shower.  You may shave your face/neck. Please follow these instructions carefully:  1.  Shower with CHG Soap the night before surgery and the  morning of Surgery.  2.  If you choose to wash your hair, wash your hair first as usual with your  normal  shampoo.  3.  After you shampoo, rinse your hair and body  thoroughly to remove the  shampoo.                            4.  Use CHG as you would any other liquid soap.  You can apply chg directly  to the skin and wash                       Gently with a scrungie or clean washcloth.  5.  Apply the CHG Soap to your body ONLY FROM THE NECK DOWN.   Do not use on face/ open                           Wound or open sores. Avoid contact with eyes, ears mouth and genitals (private parts).                       Wash face,  Genitals (private parts) with your normal soap.             6.  Wash thoroughly, paying special attention to the area where your surgery  will be performed.  7.  Thoroughly rinse your body with warm water from the neck down.  8.  DO NOT shower/wash with your normal soap after using and rinsing off  the CHG Soap.             9.  Pat yourself dry with a clean towel.            10.  Wear clean pajamas.            11.  Place clean sheets on your bed the night of your first shower and do not  sleep with pets. Day of Surgery : Do not apply any lotions/deodorants the morning of surgery.  Please wear clean clothes to the hospital/surgery center.  FAILURE TO FOLLOW THESE INSTRUCTIONS MAY RESULT IN THE CANCELLATION OF YOUR SURGERY PATIENT SIGNATURE_________________________________  NURSE SIGNATURE__________________________________  ________________________________________________________________________

## 2018-07-19 LAB — HEMOGLOBIN A1C
HEMOGLOBIN A1C: 6.3 % — AB (ref 4.8–5.6)
MEAN PLASMA GLUCOSE: 134 mg/dL

## 2018-07-19 LAB — ABO/RH: ABO/RH(D): A POS

## 2018-07-25 ENCOUNTER — Inpatient Hospital Stay (HOSPITAL_COMMUNITY): Payer: Medicare HMO | Admitting: Certified Registered Nurse Anesthetist

## 2018-07-25 ENCOUNTER — Other Ambulatory Visit: Payer: Self-pay

## 2018-07-25 ENCOUNTER — Encounter (HOSPITAL_COMMUNITY): Payer: Self-pay | Admitting: *Deleted

## 2018-07-25 ENCOUNTER — Encounter (HOSPITAL_COMMUNITY)
Admission: RE | Disposition: A | Discharge status: Home / Self Care | Payer: Self-pay | Source: Ambulatory Visit | Attending: Urology

## 2018-07-25 ENCOUNTER — Inpatient Hospital Stay (HOSPITAL_COMMUNITY)
Admission: RE | Admit: 2018-07-25 | Discharge: 2018-07-29 | DRG: 660 | Disposition: A | Discharge status: Home / Self Care | Payer: Medicare HMO | Source: Ambulatory Visit | Attending: Urology | Admitting: Urology

## 2018-07-25 DIAGNOSIS — I10 Essential (primary) hypertension: Secondary | ICD-10-CM | POA: Diagnosis present

## 2018-07-25 DIAGNOSIS — J449 Chronic obstructive pulmonary disease, unspecified: Secondary | ICD-10-CM | POA: Diagnosis present

## 2018-07-25 DIAGNOSIS — E1140 Type 2 diabetes mellitus with diabetic neuropathy, unspecified: Secondary | ICD-10-CM | POA: Diagnosis present

## 2018-07-25 DIAGNOSIS — J40 Bronchitis, not specified as acute or chronic: Secondary | ICD-10-CM

## 2018-07-25 DIAGNOSIS — G4733 Obstructive sleep apnea (adult) (pediatric): Secondary | ICD-10-CM | POA: Diagnosis present

## 2018-07-25 DIAGNOSIS — Z87891 Personal history of nicotine dependence: Secondary | ICD-10-CM | POA: Diagnosis not present

## 2018-07-25 DIAGNOSIS — Z8052 Family history of malignant neoplasm of bladder: Secondary | ICD-10-CM | POA: Diagnosis not present

## 2018-07-25 DIAGNOSIS — K660 Peritoneal adhesions (postprocedural) (postinfection): Secondary | ICD-10-CM | POA: Diagnosis present

## 2018-07-25 DIAGNOSIS — Z808 Family history of malignant neoplasm of other organs or systems: Secondary | ICD-10-CM

## 2018-07-25 DIAGNOSIS — N111 Chronic obstructive pyelonephritis: Principal | ICD-10-CM | POA: Diagnosis present

## 2018-07-25 DIAGNOSIS — N135 Crossing vessel and stricture of ureter without hydronephrosis: Secondary | ICD-10-CM | POA: Diagnosis present

## 2018-07-25 DIAGNOSIS — Z91040 Latex allergy status: Secondary | ICD-10-CM | POA: Diagnosis not present

## 2018-07-25 DIAGNOSIS — Z6841 Body Mass Index (BMI) 40.0 and over, adult: Secondary | ICD-10-CM

## 2018-07-25 DIAGNOSIS — R109 Unspecified abdominal pain: Secondary | ICD-10-CM | POA: Diagnosis present

## 2018-07-25 DIAGNOSIS — Z885 Allergy status to narcotic agent status: Secondary | ICD-10-CM | POA: Diagnosis not present

## 2018-07-25 DIAGNOSIS — R509 Fever, unspecified: Secondary | ICD-10-CM

## 2018-07-25 HISTORY — PX: ROBOT ASSISTED LAPAROSCOPIC NEPHRECTOMY: SHX5140

## 2018-07-25 HISTORY — PX: CYSTOSCOPY: SHX5120

## 2018-07-25 LAB — GLUCOSE, CAPILLARY
GLUCOSE-CAPILLARY: 189 mg/dL — AB (ref 70–99)
GLUCOSE-CAPILLARY: 207 mg/dL — AB (ref 70–99)
Glucose-Capillary: 117 mg/dL — ABNORMAL HIGH (ref 70–99)
Glucose-Capillary: 155 mg/dL — ABNORMAL HIGH (ref 70–99)
Glucose-Capillary: 166 mg/dL — ABNORMAL HIGH (ref 70–99)

## 2018-07-25 LAB — HEMOGLOBIN AND HEMATOCRIT, BLOOD
HCT: 44.1 % (ref 36.0–46.0)
Hemoglobin: 14.2 g/dL (ref 12.0–15.0)

## 2018-07-25 LAB — TYPE AND SCREEN
ABO/RH(D): A POS
Antibody Screen: NEGATIVE

## 2018-07-25 SURGERY — NEPHRECTOMY, RADICAL, ROBOT-ASSISTED, LAPAROSCOPIC, ADULT
Anesthesia: General | Laterality: Right

## 2018-07-25 MED ORDER — FENTANYL CITRATE (PF) 250 MCG/5ML IJ SOLN
INTRAMUSCULAR | Status: AC
  Filled 2018-07-25: qty 5

## 2018-07-25 MED ORDER — BUPIVACAINE LIPOSOME 1.3 % IJ SUSP
20.0000 mL | Freq: Once | INTRAMUSCULAR | Status: AC
  Administered 2018-07-25: 20 mL
  Filled 2018-07-25: qty 20

## 2018-07-25 MED ORDER — LIDOCAINE HCL (CARDIAC) PF 100 MG/5ML IV SOSY
PREFILLED_SYRINGE | INTRAVENOUS | Status: DC | PRN
  Administered 2018-07-25: 50 mg via INTRAVENOUS

## 2018-07-25 MED ORDER — PROPOFOL 10 MG/ML IV BOLUS
INTRAVENOUS | Status: AC
  Filled 2018-07-25: qty 20

## 2018-07-25 MED ORDER — ATORVASTATIN CALCIUM 20 MG PO TABS
20.0000 mg | ORAL_TABLET | Freq: Every evening | ORAL | Status: DC
  Administered 2018-07-25 – 2018-07-28 (×4): 20 mg via ORAL
  Filled 2018-07-25 (×4): qty 1

## 2018-07-25 MED ORDER — FLUTICASONE PROPIONATE 50 MCG/ACT NA SUSP
1.0000 | Freq: Every day | NASAL | Status: DC
  Administered 2018-07-26 – 2018-07-29 (×4): 1 via NASAL
  Filled 2018-07-25: qty 16

## 2018-07-25 MED ORDER — LACTATED RINGERS IV SOLN
INTRAVENOUS | Status: DC | PRN
  Administered 2018-07-25: 09:00:00 via INTRAVENOUS

## 2018-07-25 MED ORDER — SODIUM CHLORIDE 0.9 % IJ SOLN
INTRAMUSCULAR | Status: DC | PRN
  Administered 2018-07-25: 20 mL

## 2018-07-25 MED ORDER — SODIUM CHLORIDE 0.9 % IV SOLN
2.0000 g | INTRAVENOUS | Status: AC
  Administered 2018-07-25: 2 g via INTRAVENOUS
  Filled 2018-07-25: qty 20

## 2018-07-25 MED ORDER — SUGAMMADEX SODIUM 500 MG/5ML IV SOLN
INTRAVENOUS | Status: DC | PRN
  Administered 2018-07-25: 300 mg via INTRAVENOUS

## 2018-07-25 MED ORDER — FENTANYL CITRATE (PF) 100 MCG/2ML IJ SOLN
25.0000 ug | INTRAMUSCULAR | Status: DC | PRN
  Administered 2018-07-25 (×2): 25 ug via INTRAVENOUS

## 2018-07-25 MED ORDER — METOCLOPRAMIDE HCL 5 MG/ML IJ SOLN
INTRAMUSCULAR | Status: AC
  Filled 2018-07-25: qty 2

## 2018-07-25 MED ORDER — MIDAZOLAM HCL 5 MG/5ML IJ SOLN
INTRAMUSCULAR | Status: DC | PRN
  Administered 2018-07-25 (×2): 1 mg via INTRAVENOUS

## 2018-07-25 MED ORDER — LACTATED RINGERS IV SOLN: INTRAVENOUS | Status: DC

## 2018-07-25 MED ORDER — FENTANYL CITRATE (PF) 100 MCG/2ML IJ SOLN
INTRAMUSCULAR | Status: DC | PRN
  Administered 2018-07-25: 50 ug via INTRAVENOUS
  Administered 2018-07-25: 100 ug via INTRAVENOUS
  Administered 2018-07-25 (×2): 50 ug via INTRAVENOUS

## 2018-07-25 MED ORDER — LACTATED RINGERS IR SOLN
Status: DC | PRN
  Administered 2018-07-25: 1000 mL

## 2018-07-25 MED ORDER — OXYCODONE HCL 5 MG PO TABS
5.0000 mg | ORAL_TABLET | ORAL | Status: DC | PRN
  Administered 2018-07-26 – 2018-07-29 (×14): 5 mg via ORAL
  Filled 2018-07-25 (×14): qty 1

## 2018-07-25 MED ORDER — ONDANSETRON HCL 4 MG/2ML IJ SOLN: 4.0000 mg | INTRAMUSCULAR | Status: DC | PRN

## 2018-07-25 MED ORDER — SODIUM CHLORIDE 0.9 % IR SOLN
Status: DC | PRN
  Administered 2018-07-25: 3000 mL

## 2018-07-25 MED ORDER — SODIUM CHLORIDE 0.45 % IV SOLN
INTRAVENOUS | Status: DC
  Administered 2018-07-25 – 2018-07-26 (×2): via INTRAVENOUS

## 2018-07-25 MED ORDER — GABAPENTIN 300 MG PO CAPS
300.0000 mg | ORAL_CAPSULE | Freq: Three times a day (TID) | ORAL | Status: DC
  Administered 2018-07-25 – 2018-07-29 (×12): 300 mg via ORAL
  Filled 2018-07-25 (×12): qty 1

## 2018-07-25 MED ORDER — OXYCODONE-ACETAMINOPHEN 5-325 MG PO TABS: 1.0000 | ORAL_TABLET | Freq: Four times a day (QID) | ORAL | 0 refills | Status: DC | PRN

## 2018-07-25 MED ORDER — SODIUM CHLORIDE 0.9 % IJ SOLN
INTRAMUSCULAR | Status: AC
  Filled 2018-07-25: qty 20

## 2018-07-25 MED ORDER — PROPOFOL 10 MG/ML IV BOLUS
INTRAVENOUS | Status: DC | PRN
  Administered 2018-07-25 (×3): 25 mg via INTRAVENOUS
  Administered 2018-07-25: 200 mg via INTRAVENOUS

## 2018-07-25 MED ORDER — LACTATED RINGERS IV SOLN
INTRAVENOUS | Status: DC
  Administered 2018-07-25 (×2): via INTRAVENOUS

## 2018-07-25 MED ORDER — STERILE WATER FOR IRRIGATION IR SOLN
Status: DC | PRN
  Administered 2018-07-25: 1000 mL

## 2018-07-25 MED ORDER — ACETAMINOPHEN 500 MG PO TABS
1000.0000 mg | ORAL_TABLET | Freq: Four times a day (QID) | ORAL | Status: AC
  Administered 2018-07-25 – 2018-07-26 (×4): 1000 mg via ORAL
  Filled 2018-07-25 (×4): qty 2

## 2018-07-25 MED ORDER — FENTANYL CITRATE (PF) 100 MCG/2ML IJ SOLN
INTRAMUSCULAR | Status: AC
  Administered 2018-07-25: 25 ug via INTRAVENOUS
  Filled 2018-07-25: qty 2

## 2018-07-25 MED ORDER — ACETAMINOPHEN 10 MG/ML IV SOLN
INTRAVENOUS | Status: AC
  Filled 2018-07-25: qty 100

## 2018-07-25 MED ORDER — DEXAMETHASONE SODIUM PHOSPHATE 10 MG/ML IJ SOLN
INTRAMUSCULAR | Status: DC | PRN
  Administered 2018-07-25: 10 mg via INTRAVENOUS

## 2018-07-25 MED ORDER — AMLODIPINE BESYLATE 5 MG PO TABS
5.0000 mg | ORAL_TABLET | Freq: Every evening | ORAL | Status: DC
  Administered 2018-07-26 – 2018-07-28 (×3): 5 mg via ORAL
  Filled 2018-07-25 (×4): qty 1

## 2018-07-25 MED ORDER — MOMETASONE FURO-FORMOTEROL FUM 200-5 MCG/ACT IN AERO
2.0000 | INHALATION_SPRAY | Freq: Two times a day (BID) | RESPIRATORY_TRACT | Status: DC
  Administered 2018-07-26 – 2018-07-29 (×5): 2 via RESPIRATORY_TRACT
  Filled 2018-07-25: qty 8.8

## 2018-07-25 MED ORDER — INSULIN ASPART 100 UNIT/ML ~~LOC~~ SOLN
0.0000 [IU] | Freq: Three times a day (TID) | SUBCUTANEOUS | Status: DC
  Administered 2018-07-25 – 2018-07-26 (×2): 4 [IU] via SUBCUTANEOUS
  Administered 2018-07-26: 3 [IU] via SUBCUTANEOUS
  Administered 2018-07-27: 4 [IU] via SUBCUTANEOUS
  Administered 2018-07-28: 3 [IU] via SUBCUTANEOUS
  Administered 2018-07-29: 1 [IU] via SUBCUTANEOUS

## 2018-07-25 MED ORDER — ROCURONIUM BROMIDE 100 MG/10ML IV SOLN
INTRAVENOUS | Status: DC | PRN
  Administered 2018-07-25: 10 mg via INTRAVENOUS
  Administered 2018-07-25: 20 mg via INTRAVENOUS
  Administered 2018-07-25: 10 mg via INTRAVENOUS
  Administered 2018-07-25: 50 mg via INTRAVENOUS
  Administered 2018-07-25: 20 mg via INTRAVENOUS

## 2018-07-25 MED ORDER — METOCLOPRAMIDE HCL 5 MG/ML IJ SOLN
10.0000 mg | Freq: Once | INTRAMUSCULAR | Status: AC | PRN
  Administered 2018-07-25: 10 mg via INTRAVENOUS

## 2018-07-25 MED ORDER — PHENYLEPHRINE HCL 10 MG/ML IJ SOLN
INTRAMUSCULAR | Status: DC | PRN
  Administered 2018-07-25: 40 ug via INTRAVENOUS

## 2018-07-25 MED ORDER — ACETAMINOPHEN 10 MG/ML IV SOLN
INTRAVENOUS | Status: DC | PRN
  Administered 2018-07-25: 1000 mg via INTRAVENOUS

## 2018-07-25 MED ORDER — EPHEDRINE SULFATE 50 MG/ML IJ SOLN
INTRAMUSCULAR | Status: DC | PRN
  Administered 2018-07-25: 10 mg via INTRAVENOUS

## 2018-07-25 MED ORDER — DIPHENHYDRAMINE HCL 50 MG/ML IJ SOLN: 12.5000 mg | Freq: Four times a day (QID) | INTRAMUSCULAR | Status: DC | PRN

## 2018-07-25 MED ORDER — MIDAZOLAM HCL 2 MG/2ML IJ SOLN
INTRAMUSCULAR | Status: AC
  Filled 2018-07-25: qty 2

## 2018-07-25 MED ORDER — HYDROMORPHONE HCL 1 MG/ML IJ SOLN
0.5000 mg | INTRAMUSCULAR | Status: DC | PRN
  Administered 2018-07-25: 1 mg via INTRAVENOUS
  Administered 2018-07-25: 0.5 mg via INTRAVENOUS
  Administered 2018-07-26 (×2): 1 mg via INTRAVENOUS
  Filled 2018-07-25 (×4): qty 1

## 2018-07-25 MED ORDER — ONDANSETRON HCL 4 MG/2ML IJ SOLN
INTRAMUSCULAR | Status: DC | PRN
  Administered 2018-07-25: 4 mg via INTRAVENOUS

## 2018-07-25 MED ORDER — MEPERIDINE HCL 50 MG/ML IJ SOLN: 6.2500 mg | INTRAMUSCULAR | Status: DC | PRN

## 2018-07-25 MED ORDER — DIPHENHYDRAMINE HCL 12.5 MG/5ML PO ELIX
12.5000 mg | ORAL_SOLUTION | Freq: Four times a day (QID) | ORAL | Status: DC | PRN
  Administered 2018-07-27 (×2): 12.5 mg via ORAL
  Filled 2018-07-25 (×2): qty 5

## 2018-07-25 MED ORDER — FUROSEMIDE 40 MG PO TABS
40.0000 mg | ORAL_TABLET | Freq: Every day | ORAL | Status: DC | PRN
  Filled 2018-07-25: qty 1

## 2018-07-25 SURGICAL SUPPLY — 52 items
BAG LAPAROSCOPIC 12 15 PORT 16 (BASKET) ×1 IMPLANT
BAG RETRIEVAL 12/15 (BASKET) ×2
BAG RETRIEVAL 12/15MM (BASKET) ×1
CHLORAPREP W/TINT 26ML (MISCELLANEOUS) ×6 IMPLANT
CLIP VESOLOCK LG 6/CT PURPLE (CLIP) ×3 IMPLANT
CLIP VESOLOCK MED LG 6/CT (CLIP) ×3 IMPLANT
CLIP VESOLOCK XL 6/CT (CLIP) ×3 IMPLANT
COVER SURGICAL LIGHT HANDLE (MISCELLANEOUS) ×3 IMPLANT
COVER TIP SHEARS 8 DVNC (MISCELLANEOUS) ×1 IMPLANT
COVER TIP SHEARS 8MM DA VINCI (MISCELLANEOUS) ×2
COVER WAND RF STERILE (DRAPES) ×3 IMPLANT
CUTTER ECHEON FLEX ENDO 45 340 (ENDOMECHANICALS) ×3 IMPLANT
DECANTER SPIKE VIAL GLASS SM (MISCELLANEOUS) ×3 IMPLANT
DERMABOND ADVANCED (GAUZE/BANDAGES/DRESSINGS) ×2
DERMABOND ADVANCED .7 DNX12 (GAUZE/BANDAGES/DRESSINGS) ×1 IMPLANT
DRAPE ARM DVNC X/XI (DISPOSABLE) ×4 IMPLANT
DRAPE COLUMN DVNC XI (DISPOSABLE) ×1 IMPLANT
DRAPE DA VINCI XI ARM (DISPOSABLE) ×8
DRAPE DA VINCI XI COLUMN (DISPOSABLE) ×2
DRAPE INCISE IOBAN 66X45 STRL (DRAPES) ×3 IMPLANT
DRAPE SHEET LG 3/4 BI-LAMINATE (DRAPES) ×3 IMPLANT
ELECT PENCIL ROCKER SW 15FT (MISCELLANEOUS) ×3 IMPLANT
ELECT REM PT RETURN 15FT ADLT (MISCELLANEOUS) ×6 IMPLANT
GLOVE BIOGEL PI IND STRL 8 (GLOVE) ×2 IMPLANT
GLOVE BIOGEL PI INDICATOR 8 (GLOVE) ×4
GOWN STRL REUS W/TWL LRG LVL3 (GOWN DISPOSABLE) ×9 IMPLANT
IRRIG SUCT STRYKERFLOW 2 WTIP (MISCELLANEOUS) ×3
IRRIGATION SUCT STRKRFLW 2 WTP (MISCELLANEOUS) ×1 IMPLANT
KIT BASIN OR (CUSTOM PROCEDURE TRAY) ×3 IMPLANT
LOOP VESSEL MAXI BLUE (MISCELLANEOUS) IMPLANT
NEEDLE INSUFFLATION 14GA 120MM (NEEDLE) ×3 IMPLANT
POSITIONER SURGICAL ARM (MISCELLANEOUS) ×9 IMPLANT
SEAL CANN UNIV 5-8 DVNC XI (MISCELLANEOUS) ×4 IMPLANT
SEAL XI 5MM-8MM UNIVERSAL (MISCELLANEOUS) ×8
SET IRRIG Y TYPE TUR BLADDER L (SET/KITS/TRAYS/PACK) ×3 IMPLANT
SOLUTION ELECTROLUBE (MISCELLANEOUS) ×3 IMPLANT
STAPLE RELOAD 45 WHT (STAPLE) ×2 IMPLANT
STAPLE RELOAD 45MM WHITE (STAPLE) ×4
SUT ETHILON 3 0 PS 1 (SUTURE) IMPLANT
SUT MNCRL AB 4-0 PS2 18 (SUTURE) ×6 IMPLANT
SUT PDS AB 1 TP1 96 (SUTURE) ×3 IMPLANT
SUT VIC AB 0 CT1 27 (SUTURE) ×2
SUT VIC AB 0 CT1 27XBRD ANTBC (SUTURE) ×1 IMPLANT
SUT VIC AB 2-0 CT1 27 (SUTURE) ×2
SUT VIC AB 2-0 CT1 TAPERPNT 27 (SUTURE) ×1 IMPLANT
SUT VICRYL 0 UR6 27IN ABS (SUTURE) ×6 IMPLANT
TOWEL OR NON WOVEN STRL DISP B (DISPOSABLE) ×3 IMPLANT
TRAY FOLEY BAG SILVER LF 16FR (CATHETERS) ×6 IMPLANT
TRAY LAPAROSCOPIC (CUSTOM PROCEDURE TRAY) ×3 IMPLANT
TROCAR BLADELESS OPT 5 100 (ENDOMECHANICALS) ×3 IMPLANT
TROCAR XCEL 12X100 BLDLESS (ENDOMECHANICALS) ×3 IMPLANT
WATER STERILE IRR 1000ML POUR (IV SOLUTION) ×3 IMPLANT

## 2018-07-25 NOTE — Anesthesia Postprocedure Evaluation (Signed)
Anesthesia Post Note  Patient: Haley Burgess  Procedure(s) Performed: XI ROBOTIC ASSISTED LAPAROSCOPIC NEPHRECTOMY (Right ) CYSTOSCOPY WITH STENT REMOVAL  (Right )     Patient location during evaluation: PACU Anesthesia Type: General Level of consciousness: awake and alert Pain management: pain level controlled Vital Signs Assessment: post-procedure vital signs reviewed and stable Respiratory status: spontaneous breathing, nonlabored ventilation, respiratory function stable and patient connected to nasal cannula oxygen Cardiovascular status: blood pressure returned to baseline and stable Postop Assessment: no apparent nausea or vomiting Anesthetic complications: no    Last Vitals:  Vitals:   07/25/18 1415 07/25/18 1430  BP: 114/69 131/62  Pulse: 72 70  Resp: 16 (!) 22  Temp:    SpO2: 94% 94%    Last Pain:  Vitals:   07/25/18 1415  TempSrc:   PainSc: 3                  Phillips Grout

## 2018-07-25 NOTE — Discharge Instructions (Signed)

## 2018-07-25 NOTE — Transfer of Care (Signed)
Immediate Anesthesia Transfer of Care Note  Patient: Haley Burgess  Procedure(s) Performed: XI ROBOTIC ASSISTED LAPAROSCOPIC NEPHRECTOMY (Right ) CYSTOSCOPY WITH STENT REMOVAL  (Right )  Patient Location: PACU  Anesthesia Type:General  Level of Consciousness: awake, oriented, drowsy, patient cooperative and responds to stimulation  Airway & Oxygen Therapy: Patient Spontanous Breathing and Patient connected to face mask oxygen  Post-op Assessment: Report given to RN, Post -op Vital signs reviewed and stable and Patient moving all extremities  Post vital signs: Reviewed and stable  Last Vitals:  Vitals Value Taken Time  BP 108/65 07/25/2018 12:55 PM  Temp    Pulse 70 07/25/2018 12:58 PM  Resp 28 07/25/2018 12:58 PM  SpO2 96 % 07/25/2018 12:58 PM  Vitals shown include unvalidated device data.  Last Pain:  Vitals:   07/25/18 0629  TempSrc:   PainSc: 0-No pain      Patients Stated Pain Goal: 4 (07/25/18 1610)  Complications: No apparent anesthesia complications

## 2018-07-25 NOTE — Anesthesia Preprocedure Evaluation (Signed)
Anesthesia Evaluation  Patient identified by MRN, date of birth, ID band Patient awake    Reviewed: Allergy & Precautions, H&P , NPO status , Patient's Chart, lab work & pertinent test results, reviewed documented beta blocker date and time   Airway Mallampati: III  TM Distance: >3 FB Neck ROM: full    Dental  (+) Upper Dentures, Lower Dentures   Pulmonary neg pulmonary ROS, sleep apnea, Continuous Positive Airway Pressure Ventilation and Oxygen sleep apnea , COPD,  COPD inhaler, former smoker,    Pulmonary exam normal breath sounds clear to auscultation       Cardiovascular Exercise Tolerance: Good hypertension, On Medications negative cardio ROS   Rhythm:regular Rate:Normal     Neuro/Psych negative neurological ROS  negative psych ROS   GI/Hepatic negative GI ROS, Neg liver ROS,   Endo/Other  negative endocrine ROSdiabetes, Type obesity  Renal/GU negative Renal ROS  negative genitourinary   Musculoskeletal   Abdominal   Peds  Hematology negative hematology ROS (+)   Anesthesia Other Findings  Severe OSA on CPAP COPD inhalers and O2 at 2lpm "half time" Physically deconditioned with dyspnea limited any exertional activities other than slow paced walking Advanced DM-II with peripheral neuropathy Small mouth with neck fully surrounded with redundant tissue.  Risks of airway mgmt, dental injury and aspiration related to difficult a/w discussed with pt and sister-in-law.  Both understand and agree  Reproductive/Obstetrics negative OB ROS                             Anesthesia Physical  Anesthesia Plan  ASA: III  Anesthesia Plan: General   Post-op Pain Management:    Induction: Intravenous  PONV Risk Score and Plan: 4 or greater and Ondansetron and Treatment may vary due to age or medical condition  Airway Management Planned: Oral ETT  Additional Equipment:   Intra-op  Plan:   Post-operative Plan: Possible Post-op intubation/ventilation  Informed Consent: I have reviewed the patients History and Physical, chart, labs and discussed the procedure including the risks, benefits and alternatives for the proposed anesthesia with the patient or authorized representative who has indicated his/her understanding and acceptance.   Dental Advisory Given  Plan Discussed with: CRNA and Anesthesiologist  Anesthesia Plan Comments:        Anesthesia Quick Evaluation

## 2018-07-25 NOTE — Anesthesia Procedure Notes (Signed)
Procedure Name: Intubation Date/Time: 07/25/2018 8:24 AM Performed by: Montez Hageman, MD Pre-anesthesia Checklist: Patient identified, Emergency Drugs available, Suction available, Patient being monitored and Timeout performed Patient Re-evaluated:Patient Re-evaluated prior to induction Oxygen Delivery Method: Circle system utilized Preoxygenation: Pre-oxygenation with 100% oxygen (long pre O2) Induction Type: IV induction Ventilation: Mask ventilation with difficulty, Two handed mask ventilation required and Oral airway inserted - appropriate to patient size Laryngoscope Size: Glidescope and 3 Grade View: Grade IV Tube type: Oral Tube size: 7.0 mm Number of attempts: 3 Airway Equipment and Method: Video-laryngoscopy,  Bougie stylet and Rigid stylet Secured at: 20 cm Tube secured with: Tape Dental Injury: Teeth and Oropharynx as per pre-operative assessment  Difficulty Due To: Difficulty was anticipated, Difficult Airway- due to large tongue, Difficult Airway- due to reduced neck mobility, Difficult Airway- due to dentition, Difficult Airway- due to limited oral opening, Difficult Airway- due to anterior larynx and Difficult Airway- due to immobile epiglottis Future Recommendations: Recommend- induction with short-acting agent, and alternative techniques readily available Comments: Elective glidescope due to past intubation hx, body habitus. > 5 min. PreO2.  # 3 mac.  First attempt CRNA, difficulty passing blade over tongue, difficult to visualize cords with cricoid pressure.  Masked with oral airway due to beginning to desaturate.  Two handed mask required.  Second attempt Dr. Marcell Barlow.  Difficult exposure with cricoid pressure and glidescope.  Unable to pass # 7.5, masked, # 7.0 placed.  cuff leak despite added air to cuff, therefore ETT changed over bougee, cuff inflated with good seal.  VSS, saturation stable during attempts, masked between attempts.  Attempted bilateral nasal airways  lubed.  # 7 passed right nare. Unable to pass in left.  Small heme stopped spontaneously.  No trauma to oral structures noted.

## 2018-07-25 NOTE — H&P (Signed)
Urology Admission H&P  Chief Complaint: right flank pain  History of Present Illness: Haley Burgess is a 42yo with a hx of rigth UPJ obstruction and sepsis s/p right ureteral stent placement. She underwent renogram which showed a 17% functioning kidney. Ct shows a markedly dilated right renal collecting system with associated gas. She has mild righ flank pain currently.   Past Medical History:  Diagnosis Date  . Anemia   . BMI 50.0-59.9, adult (HCC)   . COPD (chronic obstructive pulmonary disease) (HCC)    per pt has oxygen at home if needed like if power were out and can not use her cpap  . Diabetic neuropathy (HCC)    legs, feet, hands  . DOE (dyspnea on exertion)   . Dysuria   . Frequency of urination   . History of sepsis 04/19/2018   due to pyelonephritis  . Hormone imbalance    per pt was told has some type of hormone imbalance that has caused no menstral cycle and facial hair  . Hypertension   . Non-functioning kidney    right  . OSA on CPAP   . Renal insufficiency   . Type 2 diabetes mellitus (HCC)    followed by pcp  . Urgency of urination   . Wears glasses   . Wears partial dentures    upper and lower but does not wear all the time   Past Surgical History:  Procedure Laterality Date  . CYSTOSCOPY WITH STENT PLACEMENT Right 04/19/2018   Procedure: CYSTOSCOPY WITH STENT PLACEMENT;  Surgeon: Bjorn Pippin, MD;  Location: [facility];  Service: Urology;  Laterality: Right;    Home Medications:  Current Facility-Administered Medications  Medication Dose Route Frequency Provider Last Rate Last Dose  . bupivacaine liposome (EXPAREL) 1.3 % injection 266 mg  20 mL Infiltration Once Malen Gauze, MD      . cefTRIAXone (ROCEPHIN) 2 g in sodium chloride 0.9 % 100 mL IVPB  2 g Intravenous 30 min Pre-Op Ronne Binning Mardene Celeste, MD      . lactated ringers infusion   Intravenous Continuous Trevor Iha, MD 50 mL/hr at 07/25/18 0645     Allergies:  Allergies  Allergen Reactions   . Hydrocodone Nausea And Vomiting    Norco, lortab  . Latex Rash    Family History  Problem Relation Age of Onset  . Bladder Cancer Mother   . Skin cancer Father   . Skin cancer Sister    Social History:  reports that she quit smoking about 8 years ago. Her smoking use included cigarettes. She quit after 11.00 years of use. She has never used smokeless tobacco. She reports that she drinks alcohol. She reports that she does not use drugs.  Review of Systems  Genitourinary: Positive for flank pain.  All other systems reviewed and are negative.   Physical Exam:  Vital signs in last 24 hours: Temp:  [98.4 F (36.9 C)] 98.4 F (36.9 C) (10/11 0551) Pulse Rate:  [67] 67 (10/11 0551) Resp:  [18] 18 (10/11 0551) BP: (135)/(76) 135/76 (10/11 0551) SpO2:  [96 %] 96 % (10/11 0551) Weight:  [140.2 kg] 140.2 kg (10/11 0629) Physical Exam  Constitutional: She is oriented to person, place, and time. She appears well-developed and well-nourished.  HENT:  Head: Normocephalic and atraumatic.  Eyes: Pupils are equal, round, and reactive to light. EOM are normal.  Neck: Normal range of motion. No thyromegaly present.  Cardiovascular: Normal rate and regular rhythm.  Respiratory: Effort  normal. No respiratory distress.  GI: Soft. She exhibits no distension.  Musculoskeletal: Normal range of motion. She exhibits no edema.  Neurological: She is alert and oriented to person, place, and time.  Skin: Skin is warm and dry.  Psychiatric: She has a normal mood and affect. Her behavior is normal. Judgment and thought content normal.    Laboratory Data:  Results for orders placed or performed during the hospital encounter of 07/25/18 (from the past 24 hour(s))  Glucose, capillary     Status: Abnormal   Collection Time: 07/25/18  6:34 AM  Result Value Ref Range   Glucose-Capillary 117 (H) 70 - 99 mg/dL   No results found for this or any previous visit (from the past 240  hour(s)). 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

## 2018-07-25 NOTE — Brief Op Note (Signed)
07/25/2018  1:06 PM  PATIENT:  Haley Burgess  42 y.o. female  PRE-OPERATIVE DIAGNOSIS:  RIGHT NONFUNCTIONING KIDNEY  POST-OPERATIVE DIAGNOSIS:  RIGHT NONFUNCTIONING KIDNEY  PROCEDURE:  Procedure(s): XI ROBOTIC ASSISTED LAPAROSCOPIC NEPHRECTOMY (Right) CYSTOSCOPY WITH STENT REMOVAL  (Right)  SURGEON:  Surgeon(s) and Role:    * McKenzie, Mardene Celeste, MD - Primary  PHYSICIAN ASSISTANT:   ASSISTANTS: Harrie Foreman   ANESTHESIA:   general  EBL:  50 mL   BLOOD ADMINISTERED:none  DRAINS: Urinary Catheter (Foley)   LOCAL MEDICATIONS USED:  NONE  SPECIMEN:  Source of Specimen:  rigth kidney  DISPOSITION OF SPECIMEN:  PATHOLOGY  COUNTS:  YES  TOURNIQUET:  * No tourniquets in log *  DICTATION: .Note written in EPIC  PLAN OF CARE: Admit to inpatient   PATIENT DISPOSITION:  PACU - hemodynamically stable.   Delay start of Pharmacological VTE agent (>24hrs) due to surgical blood loss or risk of bleeding: not applicable

## 2018-07-26 ENCOUNTER — Encounter (HOSPITAL_COMMUNITY): Payer: Self-pay | Admitting: Urology

## 2018-07-26 LAB — GLUCOSE, CAPILLARY
GLUCOSE-CAPILLARY: 139 mg/dL — AB (ref 70–99)
Glucose-Capillary: 141 mg/dL — ABNORMAL HIGH (ref 70–99)
Glucose-Capillary: 158 mg/dL — ABNORMAL HIGH (ref 70–99)
Glucose-Capillary: 119 mg/dL — ABNORMAL HIGH (ref 70–99)

## 2018-07-26 LAB — BASIC METABOLIC PANEL
ANION GAP: 10 (ref 5–15)
BUN: 18 mg/dL (ref 6–20)
CALCIUM: 8.2 mg/dL — AB (ref 8.9–10.3)
CHLORIDE: 106 mmol/L (ref 98–111)
CO2: 23 mmol/L (ref 22–32)
CREATININE: 1.09 mg/dL — AB (ref 0.44–1.00)
GFR calc non Af Amer: >60 mL/min (ref >60)
Glucose, Bld: 143 mg/dL — ABNORMAL HIGH (ref 70–99)
Potassium: 4.8 mmol/L (ref 3.5–5.1)
SODIUM: 139 mmol/L (ref 135–145)

## 2018-07-26 LAB — HEMOGLOBIN AND HEMATOCRIT, BLOOD
HEMATOCRIT: 38.6 % (ref 36.0–46.0)
HEMOGLOBIN: 11.9 g/dL — AB (ref 12.0–15.0)

## 2018-07-26 MED ORDER — ENOXAPARIN SODIUM 40 MG/0.4ML ~~LOC~~ SOLN
40.0000 mg | SUBCUTANEOUS | Status: DC
  Administered 2018-07-26 – 2018-07-29 (×4): 40 mg via SUBCUTANEOUS
  Filled 2018-07-26 (×4): qty 0.4

## 2018-07-26 MED ORDER — DOCUSATE SODIUM 100 MG PO CAPS
100.0000 mg | ORAL_CAPSULE | Freq: Two times a day (BID) | ORAL | Status: DC
  Administered 2018-07-26 – 2018-07-29 (×6): 100 mg via ORAL
  Filled 2018-07-26 (×7): qty 1

## 2018-07-26 MED ORDER — ACETAMINOPHEN 500 MG PO TABS
500.0000 mg | ORAL_TABLET | Freq: Four times a day (QID) | ORAL | Status: DC | PRN
  Administered 2018-07-26 – 2018-07-28 (×4): 500 mg via ORAL
  Filled 2018-07-26 (×4): qty 1

## 2018-07-26 MED ORDER — BISACODYL 10 MG RE SUPP
10.0000 mg | Freq: Once | RECTAL | Status: AC
  Administered 2018-07-26: 10 mg via RECTAL
  Filled 2018-07-26: qty 1

## 2018-07-26 NOTE — Progress Notes (Signed)
Patient ID: Haley Burgess, female   DOB: [birth year], 42 y.o.   MRN: [id number]  1 Day Post-Op Subjective: Pt doing well.  Pain controlled.  Tolerating clears.  No flatus.  Objective: Vital signs in last 24 hours: Temp:  [97.5 F (36.4 C)-98.6 F (37 C)] 98 F (36.7 C) (10/12 0543) Pulse Rate:  [37-78] 69 (10/12 0543) Resp:  [12-24] 20 (10/12 0543) BP: (102-144)/(51-89) 128/65 (10/12 0543) SpO2:  [91 %-99 %] 95 % (10/12 0540)  Intake/Output from previous day: 10/11 0701 - 10/12 0700 In: 4278.6 [P.O.:420; I.V.:3858.6] Out: 850 [Urine:800; Blood:50] Intake/Output this shift: No intake/output data recorded.  Physical Exam:  General: Alert and oriented CV: RRR Lungs: Clear Abdomen: Soft, ND, No bowel sounds Incisions: C/D/I Ext: NT, No erythema  Lab Results: Recent Labs    07/25/18 1338 07/26/18 0507  HGB 14.2 11.9*  HCT 44.1 38.6   BMET Recent Labs    07/26/18 0507  NA 139  K 4.8  CL 106  CO2 23  GLUCOSE 143*  BUN 18  CREATININE 1.09*  CALCIUM 8.2*     Studies/Results: No results found.  Assessment/Plan: POD # 1 s/p right RAL nephrectomy - Ambulate, IS, DVT prophylaxis - Follow renal function - Advance diet - D/C Foley - Oral pain medication   LOS: 1 day   Juan Olthoff,LES 07/26/2018, 7:30 AM

## 2018-07-27 LAB — GLUCOSE, CAPILLARY
Glucose-Capillary: 119 mg/dL — ABNORMAL HIGH (ref 70–99)
Glucose-Capillary: 122 mg/dL — ABNORMAL HIGH (ref 70–99)
Glucose-Capillary: 153 mg/dL — ABNORMAL HIGH (ref 70–99)
Glucose-Capillary: 139 mg/dL — ABNORMAL HIGH (ref 70–99)

## 2018-07-27 LAB — BASIC METABOLIC PANEL
ANION GAP: 9 (ref 5–15)
BUN: 21 mg/dL — ABNORMAL HIGH (ref 6–20)
CALCIUM: 8.3 mg/dL — AB (ref 8.9–10.3)
CO2: 25 mmol/L (ref 22–32)
CREATININE: 1.16 mg/dL — AB (ref 0.44–1.00)
Chloride: 106 mmol/L (ref 98–111)
GFR calc Af Amer: >60 mL/min (ref >60)
GFR, EST NON AFRICAN AMERICAN: 56 mL/min — AB (ref >60)
GLUCOSE: 123 mg/dL — AB (ref 70–99)
Potassium: 4 mmol/L (ref 3.5–5.1)
Sodium: 140 mmol/L (ref 135–145)

## 2018-07-27 LAB — HEMOGLOBIN AND HEMATOCRIT, BLOOD
HEMATOCRIT: 35.7 % — AB (ref 36.0–46.0)
HEMOGLOBIN: 10.9 g/dL — AB (ref 12.0–15.0)

## 2018-07-27 NOTE — Progress Notes (Signed)
Patient ID: Haley Burgess, female   DOB: March 28, 1976, 42 y.o.   MRN: [id number]  2 Days Post-Op Subjective: Pt doing well.  Tolerating diet.  Passing flatus and having bowel movements.  Pain controlled with oral pain medication.  Objective: Vital signs in last 24 hours: Temp:  [98.1 F (36.7 C)-98.8 F (37.1 C)] 98.2 F (36.8 C) (10/13 0559) Pulse Rate:  [75-89] 75 (10/13 0559) Resp:  [16-20] 20 (10/13 0559) BP: (121-143)/(49-71) 129/66 (10/13 0559) SpO2:  [94 %-100 %] 100 % (10/13 0559)  Intake/Output from previous day: 10/12 0701 - 10/13 0700 In: 720 [P.O.:720] Out: 900 [Urine:900] Intake/Output this shift: Total I/O In: -  Out: 300 [Urine:300]  Physical Exam:  General: Alert and oriented CV: RRR Lungs: Clear Abdomen: Soft, ND, Positive bowel sounds Incisions: C/D/I Ext: NT, No erythema  Lab Results: Recent Labs    07/25/18 1338 07/26/18 0507 07/27/18 0358  HGB 14.2 11.9* 10.9*  HCT 44.1 38.6 35.7*   BMET Recent Labs    07/26/18 0507 07/27/18 0358  NA 139 140  K 4.8 4.0  CL 106 106  CO2 23 25  GLUCOSE 143* 123*  BUN 18 21*  CREATININE 1.09* 1.16*  CALCIUM 8.2* 8.3*     Studies/Results: No results found.  Assessment/Plan: POD # 2 s/p RAL right nephrectomy - SL IVF - Ambulate - If tolerating diet and ambulating well, will d/c home later today   LOS: 2 days   Eswin Worrell,LES 07/27/2018, 6:45 AM

## 2018-07-27 NOTE — Progress Notes (Signed)
Notified MD of pt being able to tolerate diet with no n/v. She ambulated in hall but got flushed and SOB. Pt and mother state "need to stay one more night because don't think would do well tonight at home and would like to make sure pain meds are right". Will continue to monitor. Melton Alar, RN

## 2018-07-28 ENCOUNTER — Inpatient Hospital Stay (HOSPITAL_COMMUNITY): Payer: Medicare HMO

## 2018-07-28 LAB — GLUCOSE, CAPILLARY
GLUCOSE-CAPILLARY: 111 mg/dL — AB (ref 70–99)
GLUCOSE-CAPILLARY: 181 mg/dL — AB (ref 70–99)
Glucose-Capillary: 118 mg/dL — ABNORMAL HIGH (ref 70–99)
Glucose-Capillary: 142 mg/dL — ABNORMAL HIGH (ref 70–99)

## 2018-07-28 MED ORDER — FUROSEMIDE 40 MG PO TABS
40.0000 mg | ORAL_TABLET | Freq: Once | ORAL | Status: AC
  Administered 2018-07-28: 40 mg via ORAL
  Filled 2018-07-28: qty 1

## 2018-07-28 NOTE — Progress Notes (Signed)
Patient spiked a temp of 101.4 last night. Had Tylenol @ 2144. Temp at 2346 was 99.3

## 2018-07-28 NOTE — Progress Notes (Signed)
Patient ID: Haley Burgess, female   DOB: Jan 03, 1976, 42 y.o.   MRN: [id number]  3 Days Post-Op Subjective: Pt doing well.  Tolerating diet.  Passing flatus and having bowel movements.  Pain controlled with oral pain medication. Had fever overnight and CXR ordered which showed congestion versus bronchitis  Objective: Vital signs in last 24 hours: Temp:  [99.2 F (37.3 C)-101.4 F (38.6 C)] 99.9 F (37.7 C) (10/14 1403) Pulse Rate:  [63-85] 74 (10/14 1403) Resp:  [18-32] 32 (10/14 1403) BP: (130-139)/(49-77) 139/49 (10/14 1403) SpO2:  [90 %-100 %] 94 % (10/14 1403)  Intake/Output from previous day: 10/13 0701 - 10/14 0700 In: 460 [P.O.:460] Out: 0  Intake/Output this shift: Total I/O In: -  Out: 2450 [Urine:2450]  Physical Exam:  General: Alert and oriented CV: RRR Lungs: Clear Abdomen: Soft, ND, Positive bowel sounds Incisions: C/D/I Ext: NT, No erythema  Lab Results: Recent Labs    07/26/18 0507 07/27/18 0358  HGB 11.9* 10.9*  HCT 38.6 35.7*   BMET Recent Labs    07/26/18 0507 07/27/18 0358  NA 139 140  K 4.8 4.0  CL 106 106  CO2 23 25  GLUCOSE 143* 123*  BUN 18 21*  CREATININE 1.09* 1.16*  CALCIUM 8.2* 8.3*     Studies/Results: Dg Chest 2 View  Result Date: 07/28/2018 CLINICAL DATA:  Fever EXAM: CHEST - 2 VIEW COMPARISON:  None. FINDINGS: Cardiomegaly. Mild vascular congestion. Interstitial prominence in the perihilar and lower lung regions. No visible effusions or acute bony abnormality. IMPRESSION: Cardiomegaly with vascular congestion and mild perihilar and lower lobe interstitial prominence. This could reflect early edema or bronchitis. Electronically Signed   By: Charlett Nose M.D.   On: 07/28/2018 09:19    Assessment/Plan: POD # 3 s/p RAL right nephrectomy - lasix 40mg  once -CXR in am  LOS: 3 days   Haley Burgess 07/28/2018, 4:31 PM

## 2018-07-28 NOTE — Care Management Important Message (Signed)
Important Message  Patient Details  Name: Kayle Passarelli MRN: [id number] Date of Birth: 25-Apr-1976   Medicare Important Message Given:  Yes    Caren Macadam 07/28/2018, 1:28 PMImportant Message  Patient Details  Name: Beola Vasallo MRN: [id number] Date of Birth: Apr 02, 1976   Medicare Important Message Given:  Yes    Caren Macadam 07/28/2018, 1:28 PM

## 2018-07-29 ENCOUNTER — Inpatient Hospital Stay (HOSPITAL_COMMUNITY): Payer: Medicare HMO

## 2018-07-29 LAB — GLUCOSE, CAPILLARY
Glucose-Capillary: 110 mg/dL — ABNORMAL HIGH (ref 70–99)
Glucose-Capillary: 128 mg/dL — ABNORMAL HIGH (ref 70–99)

## 2018-07-29 MED ORDER — AZITHROMYCIN 250 MG PO TABS: ORAL_TABLET | ORAL | 0 refills | Status: AC

## 2018-07-29 MED ORDER — ACETAMINOPHEN 500 MG PO TABS
500.0000 mg | ORAL_TABLET | Freq: Four times a day (QID) | ORAL | Status: DC | PRN
  Administered 2018-07-29: 500 mg via ORAL
  Filled 2018-07-29: qty 1

## 2018-07-31 NOTE — Op Note (Signed)
Preoperative diagnosis: Right nonfunctioning kidney  Postop diagnosis: Same  Procedure: 1.  right robot assisted laparoscopic simplel nephrectomy 2. Cystoscopy with stent removal   Attending: [name], MD  Assistant: Harrie Foreman, PA  Anesthesia: General  Estimated blood loss: 100 cc  Drains: 16 French Foley catheter  Specimens: right simple nephrectomy  Antibiotics: ancef  Findings: 1 artery and 1 vein. Right ureteral stent removed intact. 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    After discussing treatment options patient decided to proceed with right robot assisted laparoscopic simple nephrectomy.  Procedure in detail: Prior to procedure consent was obtained. Patient was brought to the operating room and briefing was done sure correct patient, correct procedure, correct site.  General anesthesia was in administered patient was placed in the left lateral decubitus position.  a 16 French catheter was placed. their abdomen and flank was then prepped and draped usual sterile fashion.  A Veress needle was used to obtain pneumoperitoneum.  Once pneumoperitoneum was reestablished to 15 mmHg we then placed a 12 mm camera port lateral to the umbilicus at the lateral edge of rectus.  We then proceeded to place 3 more robotic ports. We then placed an assistant port inferior to the camera in the midline. We then placed a 5mm port for the liver retractor in the midline above the assistant port. We then docked the robot.  We then started this dissection by removing a extensive amount of anterior abdominal wall adhesions and adhesions to the liver.  We then dissected along the white line of Toldt.  We then reflected the colon medially.  We then proceeded to kocherize the duodenum. We then identified the psoas muscle.  Once this was done we traced it  down to the iliac vessels and identified the ureter.  Once we identified the gonadal vein and ureter were then traced this to the renal hilum.  The renal vein and renal artery were skeletonized.  We did we identified one renal vein one renal artery.  Using the Eth[employer] power stapler within ligated the renal artery.  Once this was done we then used a second staple load to ligate the renal vein. We then opened the ureter and brought the stent into the operative field. We were unable to remove the distal stent and elected to ligate the stent and remove the upper half first. We then ligated the ureter with a hem-o-lock clip.  Once this was done we then freed the kidney from its lateral and posterior attachments.  We then used a Endo Catch bag to remove the specimen.  Once the specimen was in the Endo Catch bag we then inspected the retroperitoneum and noted no residual bleeding.  We then removed our instruments, undocked the robot, and released the pneumoperitoneum.  We then made a right lower quardant incision to remove the specimen.  Once the specimen was removed we then closed the camera and assistant ports with 0 Vicryl in interrupted fashion.  We then closed the  Right lower quadrant incision with 0 PDS in a running fashion.  We then closed the overlying skin with 2-0 Vicryl in running fashion.  The skin was then closed with staples. We then repositioned the patient in the dorsal lithotomy position. We then prepped and draped her genitalia. Using a rigid cystoscope we removed the remainder of the right ureteral stent. The foley was replaced.  This then concluded the procedure which was well tolerated by the patient.  Complications: None  Condition: Stable, x-rayed, transferred to PACU.  Plan: Patient is to be admitted for inpatient stay. The foley catheter will be removed in the morning. They will be started on a clear liquid diet POD#1

## 2018-08-04 NOTE — Discharge Summary (Signed)
Physician Discharge Summary  Patient ID: Haley Burgess MRN: [id number] DOB/AGE: 44/14/1977 44 y.o.  Admit date: 07/25/2018 Discharge date: 07/29/2018  Admission Diagnoses: UPJ obstruction, nonfunctioning right kidney  Discharge Diagnoses:  Active Problems:   UPJ (ureteropelvic junction) obstruction   Discharged Condition: good  Hospital Course: The patient tolerated the procedure well and was transferred to the floor on IV pain meds, IV fluid. On POD#1 foley was removed, pt was started on clear liquid diet and they ambulated in the halls. On POD#2 the patient was transitioned to a regular diet, IVFs were discontinued, and the patient passed flatus. Prior to discharge the pt was tolerating a regular diet, pain was controlled on PO pain meds, they were ambulating without difficulty, and they had normal bowel function.  Consults: None  Significant Diagnostic Studies: none  Treatments: surgery: robotic right simple nephrectomy  Discharge Exam: Blood pressure (!) 141/59, pulse 75, temperature 99.1 F (37.3 C), temperature source Oral, resp. rate 18, height 5\' 3"  (1.6 m), weight (!) 140.2 kg, SpO2 97 %. General appearance: alert, cooperative and appears stated age Head: Normocephalic, without obvious abnormality, atraumatic Nose: Nares normal. Septum midline. Mucosa normal. No drainage or sinus tenderness. Neck: no adenopathy, no carotid bruit, no JVD, supple, symmetrical, trachea midline and thyroid not enlarged, symmetric, no tenderness/mass/nodules Resp: clear to auscultation bilaterally Cardio: regular rate and rhythm, S1, S2 normal, no murmur, click, rub or gallop GI: soft, non-tender; bowel sounds normal; no masses,  no organomegaly Extremities: extremities normal, atraumatic, no cyanosis or edema Neurologic: Grossly normal  Disposition:    Allergies as of 07/29/2018      Reactions   Hydrocodone Nausea And Vomiting   Norco, lortab   Latex Rash      Medication List     STOP taking these medications   OMERA 1000 MG Caps     TAKE these medications   acetaminophen 500 MG tablet Commonly known as:  TYLENOL Take 1,000 mg by mouth at bedtime.   ALBUTEROL SULFATE IN Inhale into the lungs as needed.   amLODipine 5 MG tablet Commonly known as:  NORVASC Take 1 tablet (5 mg total) by mouth daily. What changed:  when to take this   atorvastatin 20 MG tablet Commonly known as:  LIPITOR Take 20 mg by mouth every evening.   budesonide-formoterol 160-4.5 MCG/ACT inhaler Commonly known as:  SYMBICORT Inhale 1 puff into the lungs daily. Per pt only using as needed   fluticasone 50 MCG/ACT nasal spray Commonly known as:  FLONASE Place 1 spray into both nostrils daily.   furosemide 40 MG tablet Commonly known as:  LASIX Take 40 mg by mouth daily as needed for fluid or edema.   gabapentin 300 MG capsule Commonly known as:  NEURONTIN Take 300 mg by mouth 3 (three) times daily.   glipiZIDE 5 MG tablet Commonly known as:  GLUCOTROL Take 5 mg by mouth every evening.   hydrOXYzine 25 MG tablet Commonly known as:  ATARAX/VISTARIL Take 25 mg by mouth at bedtime.   oxyCODONE-acetaminophen 5-325 MG tablet Commonly known as:  PERCOCET/ROXICET Take 1-2 tablets by mouth every 6 (six) hours as needed for moderate pain or severe pain.     ASK your doctor about these medications   azithromycin 250 MG tablet Commonly known as:  ZITHROMAX Take 2 tablets (500 mg) on  Day 1,  followed by 1 tablet (250 mg) once daily on Days 2 through 5. Ask about: Should I take this medication?  Follow-up Information    Haley Burgess, Haley Celeste, MD Follow up on 08/13/2018.   Specialty:  Urology Why:  at 10:00 Contact information: [address] [place] [address] [phone]           Signed: [name] 08/04/2018, 10:42 AM

## 2018-08-13 ENCOUNTER — Ambulatory Visit (INDEPENDENT_AMBULATORY_CARE_PROVIDER_SITE_OTHER): Payer: Medicare HMO | Admitting: Urology

## 2018-08-13 DIAGNOSIS — Q6211 Congenital occlusion of ureteropelvic junction: Secondary | ICD-10-CM

## 2018-10-03 ENCOUNTER — Other Ambulatory Visit: Payer: Self-pay

## 2018-10-03 ENCOUNTER — Emergency Department (HOSPITAL_COMMUNITY): Payer: Medicare HMO

## 2018-10-03 ENCOUNTER — Emergency Department (HOSPITAL_COMMUNITY)
Admission: EM | Admit: 2018-10-03 | Discharge: 2018-10-03 | Disposition: A | Discharge status: Home / Self Care | Payer: Medicare HMO | Attending: Emergency Medicine | Admitting: Emergency Medicine

## 2018-10-03 ENCOUNTER — Encounter (HOSPITAL_COMMUNITY): Payer: Self-pay | Admitting: Emergency Medicine

## 2018-10-03 DIAGNOSIS — N390 Urinary tract infection, site not specified: Secondary | ICD-10-CM | POA: Diagnosis not present

## 2018-10-03 DIAGNOSIS — E119 Type 2 diabetes mellitus without complications: Secondary | ICD-10-CM | POA: Insufficient documentation

## 2018-10-03 DIAGNOSIS — Z87891 Personal history of nicotine dependence: Secondary | ICD-10-CM | POA: Diagnosis not present

## 2018-10-03 DIAGNOSIS — R109 Unspecified abdominal pain: Secondary | ICD-10-CM

## 2018-10-03 DIAGNOSIS — R1084 Generalized abdominal pain: Secondary | ICD-10-CM | POA: Diagnosis present

## 2018-10-03 DIAGNOSIS — Z79899 Other long term (current) drug therapy: Secondary | ICD-10-CM | POA: Insufficient documentation

## 2018-10-03 DIAGNOSIS — I10 Essential (primary) hypertension: Secondary | ICD-10-CM | POA: Diagnosis not present

## 2018-10-03 DIAGNOSIS — J449 Chronic obstructive pulmonary disease, unspecified: Secondary | ICD-10-CM | POA: Diagnosis not present

## 2018-10-03 DIAGNOSIS — Z7984 Long term (current) use of oral hypoglycemic drugs: Secondary | ICD-10-CM | POA: Insufficient documentation

## 2018-10-03 DIAGNOSIS — L03116 Cellulitis of left lower limb: Secondary | ICD-10-CM | POA: Insufficient documentation

## 2018-10-03 LAB — CBC WITH DIFFERENTIAL/PLATELET
Abs Immature Granulocytes: 0.02 10*3/uL (ref 0.00–0.07)
BASOS ABS: 0 10*3/uL (ref 0.0–0.1)
Basophils Relative: 0 %
EOS ABS: 0.1 10*3/uL (ref 0.0–0.5)
EOS PCT: 1 %
HCT: 40.1 % (ref 36.0–46.0)
Hemoglobin: 12.5 g/dL (ref 12.0–15.0)
IMMATURE GRANULOCYTES: 0 %
Lymphocytes Relative: 16 %
Lymphs Abs: 1.6 10*3/uL (ref 0.7–4.0)
MCH: 28.2 pg (ref 26.0–34.0)
MCHC: 31.2 g/dL (ref 30.0–36.0)
MCV: 90.3 fL (ref 80.0–100.0)
Monocytes Absolute: 0.7 10*3/uL (ref 0.1–1.0)
Monocytes Relative: 7 %
NEUTROS PCT: 76 %
NRBC: 0 % (ref 0.0–0.2)
Neutro Abs: 7.4 10*3/uL (ref 1.7–7.7)
Platelets: 259 10*3/uL (ref 150–400)
RBC: 4.44 MIL/uL (ref 3.87–5.11)
RDW: 15 % (ref 11.5–15.5)
WBC: 9.8 10*3/uL (ref 4.0–10.5)

## 2018-10-03 LAB — URINALYSIS, ROUTINE W REFLEX MICROSCOPIC
BILIRUBIN URINE: NEGATIVE
Glucose, UA: NEGATIVE mg/dL
KETONES UR: NEGATIVE mg/dL
NITRITE: NEGATIVE
Protein, ur: NEGATIVE mg/dL
Specific Gravity, Urine: 1.006 (ref 1.005–1.030)
pH: 6 (ref 5.0–8.0)

## 2018-10-03 LAB — BASIC METABOLIC PANEL
Anion gap: 7 (ref 5–15)
BUN: 13 mg/dL (ref 6–20)
CALCIUM: 8.9 mg/dL (ref 8.9–10.3)
CO2: 25 mmol/L (ref 22–32)
Chloride: 105 mmol/L (ref 98–111)
Creatinine, Ser: 1.04 mg/dL — ABNORMAL HIGH (ref 0.44–1.00)
Glucose, Bld: 141 mg/dL — ABNORMAL HIGH (ref 70–99)
Potassium: 3.9 mmol/L (ref 3.5–5.1)
SODIUM: 137 mmol/L (ref 135–145)

## 2018-10-03 LAB — PREGNANCY, URINE: PREG TEST UR: NEGATIVE

## 2018-10-03 LAB — BRAIN NATRIURETIC PEPTIDE: B NATRIURETIC PEPTIDE 5: 33 pg/mL (ref 0.0–100.0)

## 2018-10-03 MED ORDER — CLINDAMYCIN HCL 300 MG PO CAPS: 300.0000 mg | ORAL_CAPSULE | Freq: Three times a day (TID) | ORAL | 0 refills | Status: DC

## 2018-10-03 MED ORDER — SODIUM CHLORIDE 0.9 % IV SOLN
INTRAVENOUS | Status: DC | PRN
  Administered 2018-10-03: 18:00:00 via INTRAVENOUS

## 2018-10-03 MED ORDER — CLINDAMYCIN PHOSPHATE 600 MG/50ML IV SOLN
600.0000 mg | Freq: Once | INTRAVENOUS | Status: AC
  Administered 2018-10-03: 600 mg via INTRAVENOUS
  Filled 2018-10-03: qty 50

## 2018-10-03 NOTE — ED Triage Notes (Signed)
Patient complaining of left flank pain starting last night and left leg swelling and pain x 2 days. States she is currently being treated for UTI.

## 2018-10-03 NOTE — ED Provider Notes (Signed)
[facility] EMERGENCY DEPARTMENT Provider Note   CSN: [id number] Arrival date & time: 10/03/18  1400     History   Chief Complaint Chief Complaint  Patient presents with  . Flank Pain    HPI Haley Burgess is a 42 y.o. female.  HPI   Haley Burgess is a 42 y.o. female with past medical history of hypertension, diabetes, COPD, and previous UPJ obstruction and non-functioning kidney that resulted in a right nephrectomy in October, presents to the Emergency Department complaining of left flank pain and left lower leg pain and swelling. Flank pain began one week ago and she describes the pain as sharp and constant.  She was seen by her PCP in [place] and started on Bactrim for a UTI which she is currently taking.  She does admit that she missed 1 dose of the medication yesterday. She also complains of increased swelling, pain and redness to her left lower leg.  Pain is constant and worsens with weight bearing.  No recent injury, open wounds or sores.  She does admit to previous cellulitis.  She denies fever, vomiting, abdominal pain, hematuria and chest pain.  No previous DVT/PE.  She is suppose to be taking Lasix, but has not taken it for some time.    Past Medical History:  Diagnosis Date  . Anemia   . BMI 50.0-59.9, adult (HCC)   . COPD (chronic obstructive pulmonary disease) (HCC)    per pt has oxygen at home if needed like if power were out and can not use her cpap  . Diabetic neuropathy (HCC)    legs, feet, hands  . DOE (dyspnea on exertion)   . Dysuria   . Frequency of urination   . History of sepsis 04/19/2018   due to pyelonephritis  . Hormone imbalance    per pt was told has some type of hormone imbalance that has caused no menstral cycle and facial hair  . Hypertension   . Non-functioning kidney    right  . OSA on CPAP   . Renal insufficiency   . Type 2 diabetes mellitus (HCC)    followed by pcp  . Urgency of urination   . Wears glasses   . Wears partial dentures     upper and lower but does not wear all the time    Patient Active Problem List   Diagnosis Date Noted  . 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 Sepsis due to undetermined organism (HCC) 04/20/2018  . Acute pyelonephritis 04/20/2018  . AKI (acute kidney injury) (HCC)   . Hydronephrosis due to obstruction of ureter 04/19/2018  . Acute renal injury (HCC) 04/19/2018  . Acute lower UTI 04/19/2018  . Lactic acidosis 04/19/2018  . Hypertension   . Diabetes mellitus without complication (HCC)   . COPD (chronic obstructive pulmonary disease) (HCC)     Past Surgical History:  Procedure Laterality Date  . CYSTOSCOPY Right 07/25/2018   Procedure: CYSTOSCOPY WITH STENT REMOVAL ;  Surgeon: Malen Gauze, MD;  Location: [facility];  Service: Urology;  Laterality: Right;  . CYSTOSCOPY WITH STENT PLACEMENT Right 04/19/2018   Procedure: CYSTOSCOPY WITH STENT PLACEMENT;  Surgeon: Bjorn Pippin, MD;  Location: [facility];  Service: Urology;  Laterality: Right;  . ROBOT ASSISTED LAPAROSCOPIC NEPHRECTOMY Right 07/25/2018   Procedure: XI ROBOTIC ASSISTED LAPAROSCOPIC NEPHRECTOMY;  Surgeon: Malen Gauze, MD;  Location: [facility];  Service: Urology;  Laterality: Right;     OB History   No obstetric history on file.  Home Medications    Prior to Admission medications   Medication Sig Start Date End Date Taking? Authorizing Provider  acetaminophen (TYLENOL) 500 MG tablet Take 1,000 mg by mouth at bedtime.    [provider]  ALBUTEROL SULFATE IN Inhale into the lungs as needed.    [provider]  amLODipine (NORVASC) 5 MG tablet Take 1 tablet (5 mg total) by mouth daily. Patient taking differently: Take 5 mg by mouth every evening.  04/22/18   Catarina Hartshorn, MD  atorvastatin (LIPITOR) 20 MG tablet Take 20 mg by mouth every evening.     [provider]  budesonide-formoterol (SYMBICORT) 160-4.5 MCG/ACT inhaler Inhale 1 puff into the lungs daily. Per pt only  using as needed    [provider]  fluticasone (FLONASE) 50 MCG/ACT nasal spray Place 1 spray into both nostrils daily.    [provider]  furosemide (LASIX) 40 MG tablet Take 40 mg by mouth daily as needed for fluid or edema.    [provider]  gabapentin (NEURONTIN) 300 MG capsule Take 300 mg by mouth 3 (three) times daily.    [provider]  glipiZIDE (GLUCOTROL) 5 MG tablet Take 5 mg by mouth every evening.     [provider]  hydrOXYzine (ATARAX/VISTARIL) 25 MG tablet Take 25 mg by mouth at bedtime.     [provider]  oxyCODONE-acetaminophen (PERCOCET) 5-325 MG tablet Take 1-2 tablets by mouth every 6 (six) hours as needed for moderate pain or severe pain. 07/25/18 07/25/19  Harrie Foreman, PA-C    Family History Family History  Problem Relation Age of Onset  . Bladder Cancer Mother   . Skin cancer Father   . Skin cancer Sister     Social History Social History   Tobacco Use  . Smoking status: Former Smoker    Years: 11.00    Types: Cigarettes    Last attempt to quit: 04/19/2010    Years since quitting: 8.4  . Smokeless tobacco: Never Used  Substance Use Topics  . Alcohol use: Yes    Comment: seldom  . Drug use: Never     Allergies   Hydrocodone and Latex   Review of Systems Review of Systems  Constitutional: Negative for chills and fever.  Respiratory: Positive for shortness of breath (occassional shortness of breath). Negative for wheezing.   Cardiovascular: Negative for chest pain.  Gastrointestinal: Negative for abdominal pain, nausea and vomiting.  Genitourinary: Positive for dysuria and flank pain. Negative for frequency and vaginal bleeding.  Musculoskeletal: Negative for arthralgias and joint swelling.  Skin: Positive for color change. Negative for wound.  Neurological: Negative for dizziness, weakness, numbness and headaches.  Hematological: Negative for adenopathy.     Physical Exam Updated  Vital Signs BP (!) 146/63 (BP Location: Left Arm)   Pulse 95   Temp 98.7 F (37.1 C) (Oral)   Resp 20   Ht 5\' 2"  (1.575 m)   Wt (!) 163.3 kg   SpO2 96%   BMI 65.84 kg/m   Physical Exam Vitals signs and nursing note reviewed.  Constitutional:      General: She is not in acute distress.    Appearance: She is well-developed. She is obese.  HENT:     Head: Normocephalic and atraumatic.     Mouth/Throat:     Mouth: Mucous membranes are moist.     Pharynx: Oropharynx is clear.  Cardiovascular:     Rate and Rhythm: Normal rate and regular rhythm.  Pulses: Normal pulses.     Heart sounds: Normal heart sounds. No murmur.     Comments: Dorsalis pedis and posterior tibial pulses are palpable bilaterally Pulmonary:     Effort: Pulmonary effort is normal. No respiratory distress.     Breath sounds: Normal breath sounds.  Musculoskeletal:        General: Swelling present.     Right lower leg: No edema.     Left lower leg: Edema present.     Comments: 2+ pitting edema of the bilateral lower extremities.  Erythema and excessive warmth of the LLE extending from the ankle just distal to the knee. No oozing or open wounds.    Skin:    General: Skin is warm.     Capillary Refill: Capillary refill takes less than 2 seconds.     Findings: Erythema present.  Neurological:     General: No focal deficit present.     Mental Status: She is alert. Mental status is at baseline.     Sensory: No sensory deficit.     Motor: No weakness or abnormal muscle tone.        ED Treatments / Results  Labs (all labs ordered are listed, but only abnormal results are displayed) Labs Reviewed  URINALYSIS, ROUTINE W REFLEX MICROSCOPIC - Abnormal; Notable for the following components:      Result Value   APPearance HAZY (*)    Hgb urine dipstick SMALL (*)    Leukocytes, UA LARGE (*)    WBC, UA >50 (*)    Bacteria, UA RARE (*)    All other components within normal limits  BASIC METABOLIC PANEL -  Abnormal; Notable for the following components:   Glucose, Bld 141 (*)    Creatinine, Ser 1.04 (*)    All other components within normal limits  CBC WITH DIFFERENTIAL/PLATELET  BRAIN NATRIURETIC PEPTIDE  PREGNANCY, URINE    EKG EKG Interpretation  Date/Time:  Friday October 03 2018 17:27:21 EST Ventricular Rate:  83 PR Interval:  134 QRS Duration: 86 QT Interval:  376 QTC Calculation: 441 R Axis:   79 Text Interpretation:  Sinus rhythm with Premature supraventricular complexes Otherwise normal ECG New PVC Otherwise no significant change Confirmed by Vanetta MuldersZackowski, Scott [phone]([id number]) on 10/03/2018 5:33:39 PM   Radiology [place] Venous Img Lower Unilateral Left  Result Date: 10/03/2018 CLINICAL DATA:  Left lower extremity redness and swelling for 2 days EXAM: LEFT LOWER EXTREMITY VENOUS DOPPLER ULTRASOUND TECHNIQUE: Gray-scale sonography with graded compression, as well as color Doppler and duplex ultrasound were performed to evaluate the lower extremity deep venous systems from the level of the common femoral vein and including the common femoral, femoral, profunda femoral, popliteal and calf veins including the posterior tibial, peroneal and gastrocnemius veins when visible. The superficial great saphenous vein was also interrogated. Spectral Doppler was utilized to evaluate flow at rest and with distal augmentation maneuvers in the common femoral, femoral and popliteal veins. COMPARISON:  None. FINDINGS: Contralateral Common Femoral Vein: Respiratory phasicity is normal and symmetric with the symptomatic side. No evidence of thrombus. Normal compressibility. Common Femoral Vein: No evidence of thrombus. Normal compressibility, respiratory phasicity and response to augmentation. Saphenofemoral Junction: No evidence of thrombus. Normal compressibility and flow on color Doppler imaging. Profunda Femoral Vein: No evidence of thrombus. Normal compressibility and flow on color Doppler imaging. Femoral Vein:  No evidence of thrombus. Normal compressibility, respiratory phasicity and response to augmentation. Popliteal Vein: No evidence of thrombus. Normal compressibility, respiratory phasicity and response  to augmentation. Calf Veins: No evidence of thrombus. Normal compressibility and flow on color Doppler imaging. Superficial Great Saphenous Vein: No evidence of thrombus. Normal compressibility. Venous Reflux:  None. Other Findings:  None. IMPRESSION: No evidence of deep venous thrombosis. Electronically Signed   By: Alcide Clever M.D.   On: 10/03/2018 15:43   Ct Renal Stone Study  Result Date: 10/03/2018 CLINICAL DATA:  Left flank pain started last night. 2 day history of left leg pain and swelling. EXAM: CT ABDOMEN AND PELVIS WITHOUT CONTRAST TECHNIQUE: Multidetector CT imaging of the abdomen and pelvis was performed following the standard protocol without IV contrast. COMPARISON:  04/21/2018 FINDINGS: Lower chest: 4 mm left lower lobe pulmonary nodule (image 9/series 4) stable since 04/21/2018. No substantially order CT available for comparison. Hepatobiliary: The liver shows diffusely decreased attenuation suggesting steatosis. There is no evidence for gallstones, gallbladder wall thickening, or pericholecystic fluid. No intrahepatic or extrahepatic biliary dilation. Pancreas: No focal mass lesion. No dilatation of the main duct. No intraparenchymal cyst. No peripancreatic edema. Spleen: No splenomegaly. No focal mass lesion. Adrenals/Urinary Tract: No adrenal nodule or mass. Right kidney surgically absent. Left kidney unremarkable on noncontrast CT imaging. Left ureter appears normal. Bladder is distended. Stomach/Bowel: Stomach is nondistended. No gastric wall thickening. No evidence of outlet obstruction. Duodenum is normally positioned as is the ligament of Treitz. No small bowel wall thickening. No small bowel dilatation. The terminal ileum is normal. The appendix is normal. No gross colonic mass. No  colonic wall thickening. Vascular/Lymphatic: No abdominal aortic aneurysm. No abdominal aortic atherosclerotic calcification. Scattered small lymph nodes in the upper abdomen are stable. Small para-aortic lymph nodes are similar to smaller. Index lymph node anterior to the IVC measured previously at 12 mm is now 8 mm short axis. No pelvic sidewall lymphadenopathy. Reproductive: Posterior fibroid noted lower uterine segment. Stable 5.3 x 3.4 cm right adnexal lesion. Other: No intraperitoneal free fluid. Musculoskeletal: No worrisome lytic or sclerotic osseous abnormality. IMPRESSION: 1. Stable appearance of the left kidney and ureter. No left urinary stone disease. No secondary changes in the left kidney or ureter. 2. No left pelvic sidewall lymphadenopathy or mass lesion. 3. 4 mm left lower lobe pulmonary nodule stable since July of this year. No follow-up needed if patient is low-risk. Non-contrast chest CT can be considered in 12 months if patient is high-risk. This recommendation follows the consensus statement: Guidelines for Management of Incidental Pulmonary Nodules Detected on CT Images: From the Fleischner Society 2017; Radiology 2017; 284:228-243. 4. Interval right nephrectomy. 5. Similar appearance of the high attenuation right adnexal lesion. Routine pelvic ultrasound recommended to further evaluate. Electronically Signed   By: Kennith Center M.D.   On: 10/03/2018 18:56    Procedures Procedures (including critical care time)  Medications Ordered in ED Medications  0.9 %  sodium chloride infusion ( Intravenous Stopped 10/03/18 1827)  clindamycin (CLEOCIN) IVPB 600 mg ( Intravenous Stopped 10/03/18 1820)     Initial Impression / Assessment and Plan / ED Course  I have reviewed the triage vital signs and the nursing notes.  Pertinent labs & imaging results that were available during my care of the patient were reviewed by me and considered in my medical decision making (see chart for  details).     pt with likely recurrent cellulitis of the LLE.  Neurovascularly intact.  No open wounds or sores to the extremity.  Ultrasound negative for DVT and CT renal study is negative for ureteral calculus.  Remaining work-up  reassuring.  Creatinine slightly elevated but improved from previous.  She is currently taking Bactrim for UTI and IV Clinda given here.  Patient will also be placed on p.o. Clinda and she has an appointment with her PCP for next week.  She appears appropriate for discharge home, I have discussed these findings with Dr. Deretha EmoryZackowski.  Patient given strict return precautions.   Final Clinical Impressions(s) / ED Diagnoses   Final diagnoses:  Left flank pain  Urinary tract infection in female  Cellulitis of left lower extremity    ED Discharge Orders    None       Rosey Bathriplett, Meoshia Billing, PA-C 10/03/18 2041    Vanetta MuldersZackowski, Scott, MD 10/03/18 2330

## 2018-10-03 NOTE — ED Notes (Signed)
Patient transported to CT 

## 2018-10-03 NOTE — ED Notes (Signed)
EKG Given to MD Zackowski.

## 2018-10-03 NOTE — ED Notes (Signed)
Pt ambulatory to rm 24

## 2018-10-03 NOTE — Discharge Instructions (Addendum)
Continue taking your Bactrim twice a day as directed until its finished.  Start taking the clindamycin prescription tomorrow.  Elevate your legs when possible and you also need to take your Lasix as directed.  Follow-up with your primary doctor for recheck on Monday or Tuesday, return to the ER for any worsening symptoms.

## 2018-10-08 ENCOUNTER — Emergency Department (HOSPITAL_COMMUNITY)
Admission: EM | Admit: 2018-10-08 | Discharge: 2018-10-08 | Disposition: A | Discharge status: Home / Self Care | Payer: Medicare HMO | Attending: Emergency Medicine | Admitting: Emergency Medicine

## 2018-10-08 ENCOUNTER — Encounter (HOSPITAL_COMMUNITY): Payer: Self-pay

## 2018-10-08 ENCOUNTER — Other Ambulatory Visit: Payer: Self-pay

## 2018-10-08 DIAGNOSIS — R609 Edema, unspecified: Secondary | ICD-10-CM

## 2018-10-08 DIAGNOSIS — R600 Localized edema: Secondary | ICD-10-CM | POA: Diagnosis not present

## 2018-10-08 DIAGNOSIS — Z87891 Personal history of nicotine dependence: Secondary | ICD-10-CM | POA: Diagnosis not present

## 2018-10-08 DIAGNOSIS — Z7984 Long term (current) use of oral hypoglycemic drugs: Secondary | ICD-10-CM | POA: Diagnosis not present

## 2018-10-08 DIAGNOSIS — Z79899 Other long term (current) drug therapy: Secondary | ICD-10-CM | POA: Diagnosis not present

## 2018-10-08 DIAGNOSIS — J449 Chronic obstructive pulmonary disease, unspecified: Secondary | ICD-10-CM | POA: Insufficient documentation

## 2018-10-08 DIAGNOSIS — E119 Type 2 diabetes mellitus without complications: Secondary | ICD-10-CM | POA: Insufficient documentation

## 2018-10-08 DIAGNOSIS — Z91040 Latex allergy status: Secondary | ICD-10-CM | POA: Insufficient documentation

## 2018-10-08 DIAGNOSIS — M79605 Pain in left leg: Secondary | ICD-10-CM | POA: Diagnosis present

## 2018-10-08 DIAGNOSIS — I10 Essential (primary) hypertension: Secondary | ICD-10-CM | POA: Diagnosis not present

## 2018-10-08 LAB — CBC WITH DIFFERENTIAL/PLATELET
Abs Immature Granulocytes: 0.01 10*3/uL (ref 0.00–0.07)
BASOS PCT: 0 %
Basophils Absolute: 0 10*3/uL (ref 0.0–0.1)
EOS ABS: 0.1 10*3/uL (ref 0.0–0.5)
EOS PCT: 1 %
HEMATOCRIT: 38.4 % (ref 36.0–46.0)
Hemoglobin: 11.8 g/dL — ABNORMAL LOW (ref 12.0–15.0)
IMMATURE GRANULOCYTES: 0 %
Lymphocytes Relative: 24 %
Lymphs Abs: 1.3 10*3/uL (ref 0.7–4.0)
MCH: 27.6 pg (ref 26.0–34.0)
MCHC: 30.7 g/dL (ref 30.0–36.0)
MCV: 89.7 fL (ref 80.0–100.0)
Monocytes Absolute: 0.5 10*3/uL (ref 0.1–1.0)
Monocytes Relative: 8 %
NEUTROS PCT: 67 %
Neutro Abs: 3.6 10*3/uL (ref 1.7–7.7)
PLATELETS: 249 10*3/uL (ref 150–400)
RBC: 4.28 MIL/uL (ref 3.87–5.11)
RDW: 14.9 % (ref 11.5–15.5)
WBC: 5.5 10*3/uL (ref 4.0–10.5)
nRBC: 0 % (ref 0.0–0.2)

## 2018-10-08 LAB — COMPREHENSIVE METABOLIC PANEL
ALT: 28 U/L (ref 0–44)
ANION GAP: 7 (ref 5–15)
AST: 26 U/L (ref 15–41)
Albumin: 3.4 g/dL — ABNORMAL LOW (ref 3.5–5.0)
Alkaline Phosphatase: 78 U/L (ref 38–126)
BUN: 18 mg/dL (ref 6–20)
CO2: 25 mmol/L (ref 22–32)
Calcium: 9 mg/dL (ref 8.9–10.3)
Chloride: 108 mmol/L (ref 98–111)
Creatinine, Ser: 0.92 mg/dL (ref 0.44–1.00)
Glucose, Bld: 124 mg/dL — ABNORMAL HIGH (ref 70–99)
POTASSIUM: 4.1 mmol/L (ref 3.5–5.1)
Sodium: 140 mmol/L (ref 135–145)
TOTAL PROTEIN: 6.6 g/dL (ref 6.5–8.1)
Total Bilirubin: 0.4 mg/dL (ref 0.3–1.2)

## 2018-10-08 LAB — URINALYSIS, ROUTINE W REFLEX MICROSCOPIC
BILIRUBIN URINE: NEGATIVE
Bacteria, UA: NONE SEEN
GLUCOSE, UA: NEGATIVE mg/dL
HGB URINE DIPSTICK: NEGATIVE
Ketones, ur: NEGATIVE mg/dL
NITRITE: NEGATIVE
PROTEIN: NEGATIVE mg/dL
Specific Gravity, Urine: 1.014 (ref 1.005–1.030)
pH: 6 (ref 5.0–8.0)

## 2018-10-08 NOTE — ED Triage Notes (Signed)
Pt c/o left leg pain.  Reports was diagnosed with UTI and cellulitis last week.    PT says leg is no better.  Reports has been taking antibiotics.

## 2018-10-08 NOTE — ED Notes (Signed)
ED Provider at bedside. 

## 2018-10-08 NOTE — ED Notes (Signed)
Pt refused vital signs at time of discharge. Pt informed of the importance of vital signs, but pt still refused.

## 2018-10-08 NOTE — ED Notes (Signed)
Redness and swelling noted to left lower leg.  Open wound to shin area.  Redness also noted to top of left foot.  Pedal pulse present and palpable, capillary refill wnl.

## 2018-10-08 NOTE — ED Provider Notes (Signed)
[facility] EMERGENCY DEPARTMENT Provider Note   CSN: [id number] Arrival date & time: 10/08/18  1028     History   Chief Complaint Chief Complaint  Patient presents with  . Leg Pain    HPI Haley Burgess is a 42 y.o. female.  HPI Patient presents for reevaluation of left lower extremity.  Was seen 5 days ago and started on clindamycin for cellulitis.  Redness and warmth have improved the patient states that swelling has worsened.  Admitted to not taking her Lasix prior to evaluation several days ago but states that she has been compliant since.  She is on her feet nearly all day.  States that the swelling is better in the mornings and worse at the end of the day.  Denies any fever or chills.  Denies abdominal pain or flank pain.  No nausea or vomiting. Past Medical History:  Diagnosis Date  . Anemia   . BMI 50.0-59.9, adult (HCC)   . COPD (chronic obstructive pulmonary disease) (HCC)    per pt has oxygen at home if needed like if power were out and can not use her cpap  . Diabetic neuropathy (HCC)    legs, feet, hands  . DOE (dyspnea on exertion)   . Dysuria   . Frequency of urination   . History of sepsis 04/19/2018   due to pyelonephritis  . Hormone imbalance    per pt was told has some type of hormone imbalance that has caused no menstral cycle and facial hair  . Hypertension   . Non-functioning kidney    right  . OSA on CPAP   . Renal insufficiency   . Type 2 diabetes mellitus (HCC)    followed by pcp  . Urgency of urination   . Wears glasses   . Wears partial dentures    upper and lower but does not wear all the time    Patient Active Problem List   Diagnosis Date Noted  . 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 Sepsis due to undetermined organism (HCC) 04/20/2018  . Acute pyelonephritis 04/20/2018  . AKI (acute kidney injury) (HCC)   . Hydronephrosis due to obstruction of ureter 04/19/2018  . Acute renal injury (HCC) 04/19/2018  . Acute  lower UTI 04/19/2018  . Lactic acidosis 04/19/2018  . Hypertension   . Diabetes mellitus without complication (HCC)   . COPD (chronic obstructive pulmonary disease) (HCC)     Past Surgical History:  Procedure Laterality Date  . CYSTOSCOPY Right 07/25/2018   Procedure: CYSTOSCOPY WITH STENT REMOVAL ;  Surgeon: Malen Gauze, MD;  Location: [facility];  Service: Urology;  Laterality: Right;  . CYSTOSCOPY WITH STENT PLACEMENT Right 04/19/2018   Procedure: CYSTOSCOPY WITH STENT PLACEMENT;  Surgeon: Bjorn Pippin, MD;  Location: [facility];  Service: Urology;  Laterality: Right;  . ROBOT ASSISTED LAPAROSCOPIC NEPHRECTOMY Right 07/25/2018   Procedure: XI ROBOTIC ASSISTED LAPAROSCOPIC NEPHRECTOMY;  Surgeon: Malen Gauze, MD;  Location: [facility];  Service: Urology;  Laterality: Right;     OB History   No obstetric history on file.      Home Medications    Prior to Admission medications   Medication Sig Start Date End Date Taking? Authorizing Provider  acetaminophen (TYLENOL) 500 MG tablet Take 1,000 mg by mouth at bedtime.   Yes [provider]  amLODipine (NORVASC) 5 MG tablet Take 1 tablet (5 mg total) by mouth daily. Patient taking differently: Take 10 mg by mouth every evening.  04/22/18  Yes Tat, Onalee Huaavid, MD  atorvastatin (LIPITOR) 20 MG tablet Take 20 mg by mouth every evening.    Yes [provider]  budesonide-formoterol (SYMBICORT) 160-4.5 MCG/ACT inhaler Inhale 1 puff into the lungs daily. Per pt only using as needed   Yes [provider]  clindamycin (CLEOCIN) 300 MG capsule Take 1 capsule (300 mg total) by mouth 3 (three) times daily. 10/03/18  Yes Triplett, Tammy, PA-C  fluticasone (FLONASE) 50 MCG/ACT nasal spray Place 1 spray into both nostrils daily.   Yes [provider]  furosemide (LASIX) 40 MG tablet Take 40 mg by mouth daily as needed for fluid or edema.   Yes [provider]  gabapentin (NEURONTIN) 300 MG capsule Take 300 mg by  mouth 3 (three) times daily.   Yes [provider]  glipiZIDE (GLUCOTROL) 5 MG tablet Take 5 mg by mouth every evening.    Yes [provider]  hydrOXYzine (ATARAX/VISTARIL) 25 MG tablet Take 25 mg by mouth at bedtime.    Yes [provider]  Multiple Vitamins-Minerals (MULTIVITAMIN WITH MINERALS) tablet Take 1 tablet by mouth daily.   Yes [provider]  oxyCODONE-acetaminophen (PERCOCET) 5-325 MG tablet Take 1-2 tablets by mouth every 6 (six) hours as needed for moderate pain or severe pain. 07/25/18 07/25/19 Yes Dancy, Amanda, PA-C  ALBUTEROL SULFATE IN Inhale into the lungs as needed.    [provider]    Family History Family History  Problem Relation Age of Onset  . Bladder Cancer Mother   . Skin cancer Father   . Skin cancer Sister     Social History Social History   Tobacco Use  . Smoking status: Former Smoker    Years: 11.00    Types: Cigarettes    Last attempt to quit: 04/19/2010    Years since quitting: 8.4  . Smokeless tobacco: Never Used  Substance Use Topics  . Alcohol use: Yes    Comment: seldom  . Drug use: Never     Allergies   Hydrocodone; Lortab [hydrocodone-acetaminophen]; and Latex   Review of Systems Review of Systems  Constitutional: Negative for chills and fever.  Respiratory: Negative for cough and shortness of breath.   Cardiovascular: Negative for chest pain.  Gastrointestinal: Negative for abdominal pain, diarrhea, nausea and vomiting.  Genitourinary: Negative for difficulty urinating, dysuria and flank pain.  Musculoskeletal: Negative for back pain, myalgias and neck pain.  Skin: Positive for color change and wound.  Neurological: Negative for dizziness, weakness, light-headedness, numbness and headaches.  All other systems reviewed and are negative.    Physical Exam Updated Vital Signs BP (!) 117/51   Pulse 70   Temp 97.9 F (36.6 C) (Oral)   Resp 18   Ht 5\' 2"  (1.575 m)   Wt (!)  142.9 kg   SpO2 94%   BMI 57.61 kg/m   Physical Exam Vitals signs and nursing note reviewed.  Constitutional:      Appearance: Normal appearance. She is well-developed.  HENT:     Head: Normocephalic and atraumatic.     Mouth/Throat:     Mouth: Mucous membranes are moist.  Eyes:     Pupils: Pupils are equal, round, and reactive to light.  Neck:     Musculoskeletal: Normal range of motion and neck supple.  Cardiovascular:     Rate and Rhythm: Normal rate and regular rhythm.  Pulmonary:     Effort: Pulmonary effort is normal.     Breath sounds: Normal breath sounds.  Abdominal:     General: Bowel sounds are normal.     Palpations: Abdomen is soft.     Tenderness: There is abdominal tenderness. There is no right CVA tenderness, left CVA tenderness, guarding or rebound.     Comments: Mild left lower quadrant tenderness to palpation without rebound or guarding.  Musculoskeletal: Normal range of motion.        General: Swelling present. No tenderness.     Right lower leg: Edema present.     Left lower leg: Edema present.     Comments: 3+ edema in the left lower extremity.  There is an draining wound roughly 3 cm in diameter over the distal anterior tibia.  There is erythema extending from the ankle all the way to the mid calf.  No increased warmth or definite tenderness to palpation.  No fluctuance appreciated.  2+ distal pulses bilaterally.  Patient has 2+ edema to the right lower extremity.  Hyperpigmentation in the same distribution as the erythema on the left.    Skin:    General: Skin is warm and dry.     Findings: Erythema present. No rash.  Neurological:     General: No focal deficit present.     Mental Status: She is alert and oriented to person, place, and time.     Comments: Moving all extremities without focal deficit.  Sensation fully intact.  Psychiatric:        Behavior: Behavior normal.      ED Treatments / Results  Labs (all labs ordered are listed, but only  abnormal results are displayed) Labs Reviewed  CBC WITH DIFFERENTIAL/PLATELET - Abnormal; Notable for the following components:      Result Value   Hemoglobin 11.8 (*)    All other components within normal limits  COMPREHENSIVE METABOLIC PANEL - Abnormal; Notable for the following components:   Glucose, Bld 124 (*)    Albumin 3.4 (*)    All other components within normal limits  URINALYSIS, ROUTINE W REFLEX MICROSCOPIC - Abnormal; Notable for the following components:   Leukocytes, UA MODERATE (*)    All other components within normal limits    EKG None  Radiology No results found.  Procedures Procedures (including critical care time)  Medications Ordered in ED Medications - No data to display   Initial Impression / Assessment and Plan / ED Course  I have reviewed the triage vital signs and the nursing notes.  Pertinent labs & imaging results that were available during my care of the patient were reviewed by me and considered in my medical decision making (see chart for details).     No warmth.  Normal white blood cell count.  Believe that the patient's cellulitis is improving.  She is advised to keep her feet elevated and use compression stockings for the edema.  Strict return precautions given.  Final Clinical Impressions(s) / ED Diagnoses   Final diagnoses:  Peripheral edema    ED Discharge Orders    None       Loren RacerYelverton, Tameyah Koch, MD 10/09/18 1557

## 2018-11-12 ENCOUNTER — Ambulatory Visit: Payer: Medicare HMO | Admitting: Urology

## 2018-11-12 DIAGNOSIS — Q6211 Congenital occlusion of ureteropelvic junction: Secondary | ICD-10-CM

## 2018-12-24 ENCOUNTER — Encounter (HOSPITAL_COMMUNITY): Payer: Self-pay | Admitting: Emergency Medicine

## 2018-12-24 ENCOUNTER — Other Ambulatory Visit: Payer: Self-pay

## 2018-12-24 ENCOUNTER — Emergency Department (HOSPITAL_COMMUNITY)
Admission: EM | Admit: 2018-12-24 | Discharge: 2018-12-25 | Disposition: A | Discharge status: Home / Self Care | Payer: Medicare HMO | Attending: Emergency Medicine | Admitting: Emergency Medicine

## 2018-12-24 ENCOUNTER — Emergency Department (HOSPITAL_COMMUNITY): Payer: Medicare HMO

## 2018-12-24 DIAGNOSIS — I10 Essential (primary) hypertension: Secondary | ICD-10-CM | POA: Diagnosis not present

## 2018-12-24 DIAGNOSIS — Z7984 Long term (current) use of oral hypoglycemic drugs: Secondary | ICD-10-CM | POA: Diagnosis not present

## 2018-12-24 DIAGNOSIS — Z79899 Other long term (current) drug therapy: Secondary | ICD-10-CM | POA: Insufficient documentation

## 2018-12-24 DIAGNOSIS — R05 Cough: Secondary | ICD-10-CM | POA: Diagnosis present

## 2018-12-24 DIAGNOSIS — J449 Chronic obstructive pulmonary disease, unspecified: Secondary | ICD-10-CM | POA: Insufficient documentation

## 2018-12-24 DIAGNOSIS — J40 Bronchitis, not specified as acute or chronic: Secondary | ICD-10-CM | POA: Insufficient documentation

## 2018-12-24 DIAGNOSIS — Z87891 Personal history of nicotine dependence: Secondary | ICD-10-CM | POA: Diagnosis not present

## 2018-12-24 DIAGNOSIS — E119 Type 2 diabetes mellitus without complications: Secondary | ICD-10-CM | POA: Insufficient documentation

## 2018-12-24 LAB — INFLUENZA PANEL BY PCR (TYPE A & B)
Influenza A By PCR: NEGATIVE
Influenza B By PCR: NEGATIVE

## 2018-12-24 MED ORDER — AZITHROMYCIN 250 MG PO TABS
500.0000 mg | ORAL_TABLET | Freq: Once | ORAL | Status: AC
  Administered 2018-12-24: 500 mg via ORAL
  Filled 2018-12-24: qty 2

## 2018-12-24 MED ORDER — PREDNISONE 50 MG PO TABS
60.0000 mg | ORAL_TABLET | Freq: Once | ORAL | Status: AC
  Administered 2018-12-24: 60 mg via ORAL
  Filled 2018-12-24: qty 1

## 2018-12-24 MED ORDER — ACETAMINOPHEN 500 MG PO TABS
1000.0000 mg | ORAL_TABLET | Freq: Once | ORAL | Status: AC
  Administered 2018-12-24: 1000 mg via ORAL
  Filled 2018-12-24: qty 2

## 2018-12-24 MED ORDER — IPRATROPIUM-ALBUTEROL 0.5-2.5 (3) MG/3ML IN SOLN
3.0000 mL | Freq: Once | RESPIRATORY_TRACT | Status: AC
  Administered 2018-12-24: 3 mL via RESPIRATORY_TRACT
  Filled 2018-12-24: qty 3

## 2018-12-24 NOTE — ED Triage Notes (Signed)
Patient complaining of cough and shortness of breath x 2 days. States fever started today. States she was exposed to flu recently.

## 2018-12-24 NOTE — ED Provider Notes (Signed)
Emergency Department Provider Note   I have reviewed the triage vital signs and the nursing notes.   HISTORY  Chief Complaint Cough   HPI Haley Burgess is a 43 y.o. female with multiple medical problems as documented below the presents to the emergency department today with 3 days of cough, fever, chills.  Patient states she has history of COPD and she is felt this in the past pneumonia but she is also exposed to somebody with influenza.  Patient states she did get a flu shot this year.  No travels or known exposures other sick contacts.  Has not measured her temperature at home.  She is coughing up some sputum but mostly just having runny nose and congestion and chest congestion and rattling when she breathes.  She has inhalers that she uses at home but she has not tried anything different for this illness.  She has not tried thing over-the-counter. No other associated or modifying symptoms.    Past Medical History:  Diagnosis Date  . Anemia   . BMI 50.0-59.9, adult (HCC)   . COPD (chronic obstructive pulmonary disease) (HCC)    per pt has oxygen at home if needed like if power were out and can not use her cpap  . Diabetic neuropathy (HCC)    legs, feet, hands  . DOE (dyspnea on exertion)   . Dysuria   . Frequency of urination   . History of sepsis 04/19/2018   due to pyelonephritis  . Hormone imbalance    per pt was told has some type of hormone imbalance that has caused no menstral cycle and facial hair  . Hypertension   . Non-functioning kidney    right  . OSA on CPAP   . Renal insufficiency   . Type 2 diabetes mellitus (HCC)    followed by pcp  . Urgency of urination   . Wears glasses   . Wears partial dentures    upper and lower but does not wear all the time    Patient Active Problem List   Diagnosis Date Noted  . 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 Sepsis due to undetermined organism (HCC) 04/20/2018  . Acute pyelonephritis 04/20/2018   . AKI (acute kidney injury) (HCC)   . Hydronephrosis due to obstruction of ureter 04/19/2018  . Acute renal injury (HCC) 04/19/2018  . Acute lower UTI 04/19/2018  . Lactic acidosis 04/19/2018  . Hypertension   . Diabetes mellitus without complication (HCC)   . COPD (chronic obstructive pulmonary disease) (HCC)     Past Surgical History:  Procedure Laterality Date  . CYSTOSCOPY Right 07/25/2018   Procedure: CYSTOSCOPY WITH STENT REMOVAL ;  Surgeon: Malen Gauze, MD;  Location: [facility];  Service: Urology;  Laterality: Right;  . CYSTOSCOPY WITH STENT PLACEMENT Right 04/19/2018   Procedure: CYSTOSCOPY WITH STENT PLACEMENT;  Surgeon: Bjorn Pippin, MD;  Location: [facility];  Service: Urology;  Laterality: Right;  . ROBOT ASSISTED LAPAROSCOPIC NEPHRECTOMY Right 07/25/2018   Procedure: XI ROBOTIC ASSISTED LAPAROSCOPIC NEPHRECTOMY;  Surgeon: Malen Gauze, MD;  Location: [facility];  Service: Urology;  Laterality: Right;    Current Outpatient Rx  . Order #: [id number] Class: Historical Med  . Order #: [id number] Class: Historical Med  . Order #: [id number] Class: Historical Med  . Order #: [id number] Class: Historical Med  . Order #: [id number] Class: Historical Med  . Order #: [id number] Class: Historical Med  . Order #: [id number] Class: Historical Med  . Order #: [id number] Class: Historical Med  .  Order #: [id number] Class: Historical Med  . Order #: [id number] Class: Historical Med  . Order #: [id number] Class: Historical Med  . Order #: [id number] Class: Historical Med  . Order #: [id number] Class: Historical Med  . Order #: [id number] Class: Historical Med  . Order #: [id number] Class: Historical Med  . Order #: [id number] Class: Print  . Order #: [id number] Class: Print    Allergies Lortab [hydrocodone-acetaminophen] and Latex  Family History  Problem Relation Age of Onset  . Bladder Cancer Mother   . Skin cancer Father   . Skin cancer Sister     Social History Social History   Tobacco Use  .  Smoking status: Former Smoker    Years: 11.00    Types: Cigarettes    Last attempt to quit: 04/19/2010    Years since quitting: 8.6  . Smokeless tobacco: Never Used  Substance Use Topics  . Alcohol use: Yes    Comment: seldom  . Drug use: Never    Review of Systems  All other systems negative except as documented in the HPI. All pertinent positives and negatives as reviewed in the HPI. ____________________________________________   PHYSICAL EXAM:  VITAL SIGNS: ED Triage Vitals  Enc Vitals Group     BP 12/24/18 2044 (!) 180/79     Pulse Rate 12/24/18 2044 (!) 101     Resp 12/24/18 2044 (!) 24     Temp 12/24/18 2044 (!) 102.2 F (39 C)     Temp Source 12/24/18 2044 Oral     SpO2 12/24/18 2044 95 %     Weight 12/24/18 2045 (!) 310 lb (140.6 kg)     Height 12/24/18 2045 5\' 3"  (1.6 m)    Constitutional: Alert and oriented. Well appearing and in no acute distress. Eyes: Conjunctivae are normal. PERRL. EOMI. Head: Atraumatic. Nose: No congestion/rhinnorhea. Mouth/Throat: Mucous membranes are moist.  Oropharynx non-erythematous. Neck: No stridor.  No meningeal signs.   Cardiovascular: Normal rate, regular rhythm. Good peripheral circulation. Grossly normal heart sounds.   Respiratory: Normal respiratory effort.  No retractions. Lungs diffusely diminished, wheezing in LUL only. Gastrointestinal: Soft and nontender. No distention.  Musculoskeletal: No lower extremity tenderness nor edema. No gross deformities of extremities. Neurologic:  Normal speech and language. No gross focal neurologic deficits are appreciated.  Skin:  Skin is warm, dry and intact. No rash noted.   ____________________________________________   LABS (all labs ordered are listed, but only abnormal results are displayed)  Labs Reviewed  INFLUENZA PANEL BY PCR (TYPE A & B)   ____________________________________________  RADIOLOGY  Dg Chest 2 View  Result Date: 12/24/2018 CLINICAL DATA:  Cough and  dyspnea x2 days EXAM: CHEST - 2 VIEW COMPARISON:  07/29/2018 FINDINGS: Stable borderline cardiomegaly. Nonaneurysmal thoracic aorta. Diffuse interstitial prominence is noted that may reflect bronchitic change. No alveolar consolidation, effusion or pneumothorax. No acute osseous abnormality. IMPRESSION: Mild interstitial prominence bilaterally that may reflect acute bronchitic change. Electronically Signed   By: Tollie Ethavid  Kwon M.D.   On: 12/24/2018 21:17    ____________________________________________   PROCEDURES  Procedure(s) performed:   Procedures   ____________________________________________   INITIAL IMPRESSION / ASSESSMENT AND PLAN / ED COURSE  On my examination patient heart rate seems to have improved.  Fever seems to have improved as well.  X-ray is clear with bronchitic changes and influenza test is negative.  Suspect this still could be influenza however she is over 72 hours out from the onset of illness and does not appear sick enough for me to consider Tamiflu.  Also could just be bronchitis and since she did have a temperature of 102 will treat for bacterial causes of that.  We will give her a breathing treatment and reassess.  Normal work-up.  Significantly improved breathing.  Will discharge on treatment for bronchitis.     Pertinent labs & imaging results that were available during my care of the patient were reviewed by me and considered in my medical decision making (see chart for details).  ____________________________________________  FINAL CLINICAL IMPRESSION(S) / ED DIAGNOSES  Final diagnoses:  Bronchitis     MEDICATIONS GIVEN DURING THIS VISIT:  Medications  acetaminophen (TYLENOL) tablet 1,000 mg (1,000 mg Oral Given 12/24/18 2054)  ipratropium-albuterol (DUONEB) 0.5-2.5 (3) MG/3ML nebulizer solution 3 mL (3 mLs Nebulization Given 12/24/18 2357)  predniSONE (DELTASONE) tablet 60 mg (60 mg Oral Given 12/24/18 2333)  azithromycin (ZITHROMAX) tablet 500 mg  (500 mg Oral Given 12/24/18 2333)  aerochamber plus with mask device 1 each (1 each Other Given 12/25/18 0116)     NEW OUTPATIENT MEDICATIONS STARTED DURING THIS VISIT:  Discharge Medication List as of 12/25/2018 12:55 AM    START taking these medications   Details  azithromycin (ZITHROMAX) 250 MG tablet Take 1 tablet (250 mg total) by mouth daily. Take 1 every day until finished., Starting Thu 12/25/2018, Print    predniSONE (DELTASONE) 20 MG tablet 2 tabs po daily x 4 days, Print        Note:  This note was prepared with assistance of Dragon voice recognition software. Occasional wrong-word or sound-a-like substitutions may have occurred due to the inherent limitations of voice recognition software.   Genella Bas, Barbara Cower, MD 12/25/18 [phone]

## 2018-12-25 ENCOUNTER — Other Ambulatory Visit: Payer: Self-pay

## 2018-12-25 MED ORDER — AZITHROMYCIN 250 MG PO TABS: 250.0000 mg | ORAL_TABLET | Freq: Every day | ORAL | 0 refills | Status: DC

## 2018-12-25 MED ORDER — PREDNISONE 20 MG PO TABS: ORAL_TABLET | ORAL | 0 refills | Status: DC

## 2018-12-25 MED ORDER — AEROCHAMBER PLUS FLO-VU MISC
1.0000 | Freq: Once | Status: AC
  Administered 2018-12-25: 1
  Filled 2018-12-25: qty 1

## 2018-12-28 ENCOUNTER — Other Ambulatory Visit: Payer: Self-pay

## 2018-12-28 ENCOUNTER — Observation Stay (HOSPITAL_COMMUNITY)
Admission: EM | Admit: 2018-12-28 | Discharge: 2018-12-30 | Disposition: A | Discharge status: Home / Self Care | Payer: Medicare HMO | Attending: Internal Medicine | Admitting: Internal Medicine

## 2018-12-28 ENCOUNTER — Encounter (HOSPITAL_COMMUNITY): Payer: Self-pay | Admitting: Emergency Medicine

## 2018-12-28 ENCOUNTER — Emergency Department (HOSPITAL_COMMUNITY): Payer: Medicare HMO

## 2018-12-28 DIAGNOSIS — Z6841 Body Mass Index (BMI) 40.0 and over, adult: Secondary | ICD-10-CM | POA: Insufficient documentation

## 2018-12-28 DIAGNOSIS — I10 Essential (primary) hypertension: Secondary | ICD-10-CM | POA: Diagnosis not present

## 2018-12-28 DIAGNOSIS — Z87891 Personal history of nicotine dependence: Secondary | ICD-10-CM | POA: Insufficient documentation

## 2018-12-28 DIAGNOSIS — Z7984 Long term (current) use of oral hypoglycemic drugs: Secondary | ICD-10-CM | POA: Insufficient documentation

## 2018-12-28 DIAGNOSIS — E1140 Type 2 diabetes mellitus with diabetic neuropathy, unspecified: Secondary | ICD-10-CM | POA: Insufficient documentation

## 2018-12-28 DIAGNOSIS — R0602 Shortness of breath: Secondary | ICD-10-CM | POA: Diagnosis not present

## 2018-12-28 DIAGNOSIS — E119 Type 2 diabetes mellitus without complications: Secondary | ICD-10-CM

## 2018-12-28 DIAGNOSIS — E785 Hyperlipidemia, unspecified: Secondary | ICD-10-CM | POA: Insufficient documentation

## 2018-12-28 DIAGNOSIS — Z79899 Other long term (current) drug therapy: Secondary | ICD-10-CM | POA: Diagnosis not present

## 2018-12-28 DIAGNOSIS — R05 Cough: Secondary | ICD-10-CM | POA: Diagnosis present

## 2018-12-28 DIAGNOSIS — J441 Chronic obstructive pulmonary disease with (acute) exacerbation: Principal | ICD-10-CM | POA: Diagnosis present

## 2018-12-28 DIAGNOSIS — Z91040 Latex allergy status: Secondary | ICD-10-CM | POA: Diagnosis not present

## 2018-12-28 DIAGNOSIS — K219 Gastro-esophageal reflux disease without esophagitis: Secondary | ICD-10-CM

## 2018-12-28 HISTORY — DX: Bronchitis, not specified as acute or chronic: J40

## 2018-12-28 LAB — CBC WITH DIFFERENTIAL/PLATELET
Abs Immature Granulocytes: 0.02 10*3/uL (ref 0.00–0.07)
Basophils Absolute: 0 10*3/uL (ref 0.0–0.1)
Basophils Relative: 1 %
Eosinophils Absolute: 0 10*3/uL (ref 0.0–0.5)
Eosinophils Relative: 0 %
HCT: 38.4 % (ref 36.0–46.0)
Hemoglobin: 12.4 g/dL (ref 12.0–15.0)
Immature Granulocytes: 0 %
Lymphocytes Relative: 25 %
Lymphs Abs: 1.5 10*3/uL (ref 0.7–4.0)
MCH: 29.6 pg (ref 26.0–34.0)
MCHC: 32.3 g/dL (ref 30.0–36.0)
MCV: 91.6 fL (ref 80.0–100.0)
Monocytes Absolute: 0.4 10*3/uL (ref 0.1–1.0)
Monocytes Relative: 7 %
Neutro Abs: 3.9 10*3/uL (ref 1.7–7.7)
Neutrophils Relative %: 67 %
Platelets: 191 10*3/uL (ref 150–400)
RBC: 4.19 MIL/uL (ref 3.87–5.11)
RDW: 16.3 % — ABNORMAL HIGH (ref 11.5–15.5)
WBC: 5.9 10*3/uL (ref 4.0–10.5)
nRBC: 0 % (ref 0.0–0.2)

## 2018-12-28 LAB — COMPREHENSIVE METABOLIC PANEL
ALK PHOS: 75 U/L (ref 38–126)
ALT: 32 U/L (ref 0–44)
AST: 27 U/L (ref 15–41)
Albumin: 3.4 g/dL — ABNORMAL LOW (ref 3.5–5.0)
Anion gap: 9 (ref 5–15)
BUN: 24 mg/dL — ABNORMAL HIGH (ref 6–20)
CALCIUM: 8.4 mg/dL — AB (ref 8.9–10.3)
CO2: 24 mmol/L (ref 22–32)
CREATININE: 0.91 mg/dL (ref 0.44–1.00)
Chloride: 106 mmol/L (ref 98–111)
GFR calc Af Amer: >60 mL/min (ref >60)
GFR calc non Af Amer: >60 mL/min (ref >60)
Glucose, Bld: 190 mg/dL — ABNORMAL HIGH (ref 70–99)
Potassium: 3.8 mmol/L (ref 3.5–5.1)
Sodium: 139 mmol/L (ref 135–145)
Total Bilirubin: 0.7 mg/dL (ref 0.3–1.2)
Total Protein: 6.3 g/dL — ABNORMAL LOW (ref 6.5–8.1)

## 2018-12-28 LAB — BRAIN NATRIURETIC PEPTIDE: B Natriuretic Peptide: 19 pg/mL (ref 0.0–100.0)

## 2018-12-28 LAB — TROPONIN I: Troponin I: <0.03 ng/mL (ref <0.03)

## 2018-12-28 MED ORDER — LOSARTAN POTASSIUM 50 MG PO TABS
25.0000 mg | ORAL_TABLET | Freq: Every evening | ORAL | Status: DC
  Administered 2018-12-28 – 2018-12-29 (×2): 25 mg via ORAL
  Filled 2018-12-28 (×2): qty 1

## 2018-12-28 MED ORDER — PREDNISONE 10 MG PO TABS: 10.0000 mg | ORAL_TABLET | Freq: Two times a day (BID) | ORAL | Status: DC

## 2018-12-28 MED ORDER — GLIPIZIDE 5 MG PO TABS
5.0000 mg | ORAL_TABLET | Freq: Every evening | ORAL | Status: DC
  Filled 2018-12-28 (×3): qty 1

## 2018-12-28 MED ORDER — FLUTICASONE PROPIONATE 50 MCG/ACT NA SUSP
1.0000 | Freq: Every evening | NASAL | Status: DC
  Administered 2018-12-29: 1 via NASAL
  Filled 2018-12-28 (×2): qty 16

## 2018-12-28 MED ORDER — IPRATROPIUM-ALBUTEROL 0.5-2.5 (3) MG/3ML IN SOLN
3.0000 mL | Freq: Once | RESPIRATORY_TRACT | Status: AC
  Administered 2018-12-28: 3 mL via RESPIRATORY_TRACT
  Filled 2018-12-28: qty 3

## 2018-12-28 MED ORDER — SODIUM CHLORIDE 0.9 % IV SOLN: 250.0000 mL | INTRAVENOUS | Status: DC | PRN

## 2018-12-28 MED ORDER — ALBUTEROL SULFATE 108 (90 BASE) MCG/ACT IN AEPB: 1.0000 | INHALATION_SPRAY | Freq: Four times a day (QID) | RESPIRATORY_TRACT | Status: DC | PRN

## 2018-12-28 MED ORDER — OMEGA-3-ACID ETHYL ESTERS 1 G PO CAPS
1.0000 | ORAL_CAPSULE | Freq: Two times a day (BID) | ORAL | Status: DC
  Administered 2018-12-29 – 2018-12-30 (×3): 1 g via ORAL
  Filled 2018-12-28 (×9): qty 1

## 2018-12-28 MED ORDER — ALBUTEROL SULFATE (2.5 MG/3ML) 0.083% IN NEBU: 2.5000 mg | INHALATION_SOLUTION | Freq: Four times a day (QID) | RESPIRATORY_TRACT | Status: DC | PRN

## 2018-12-28 MED ORDER — HYDROXYZINE HCL 25 MG PO TABS
25.0000 mg | ORAL_TABLET | Freq: Every day | ORAL | Status: DC
  Administered 2018-12-28 – 2018-12-29 (×2): 25 mg via ORAL
  Filled 2018-12-28 (×2): qty 1

## 2018-12-28 MED ORDER — GABAPENTIN 300 MG PO CAPS
300.0000 mg | ORAL_CAPSULE | Freq: Three times a day (TID) | ORAL | Status: DC
  Administered 2018-12-28 – 2018-12-30 (×5): 300 mg via ORAL
  Filled 2018-12-28 (×5): qty 1

## 2018-12-28 MED ORDER — SODIUM CHLORIDE 0.9% FLUSH
3.0000 mL | Freq: Two times a day (BID) | INTRAVENOUS | Status: DC
  Administered 2018-12-28 – 2018-12-29 (×3): 3 mL via INTRAVENOUS

## 2018-12-28 MED ORDER — METHYLPREDNISOLONE SODIUM SUCC 125 MG IJ SOLR
80.0000 mg | Freq: Two times a day (BID) | INTRAMUSCULAR | Status: DC
  Administered 2018-12-28 – 2018-12-30 (×4): 80 mg via INTRAVENOUS
  Filled 2018-12-28 (×3): qty 2

## 2018-12-28 MED ORDER — IPRATROPIUM-ALBUTEROL 0.5-2.5 (3) MG/3ML IN SOLN
3.0000 mL | Freq: Four times a day (QID) | RESPIRATORY_TRACT | Status: DC
  Administered 2018-12-29 (×4): 3 mL via RESPIRATORY_TRACT
  Filled 2018-12-28 (×4): qty 3

## 2018-12-28 MED ORDER — FUROSEMIDE 40 MG PO TABS
40.0000 mg | ORAL_TABLET | Freq: Every day | ORAL | Status: DC | PRN
  Administered 2018-12-29: 40 mg via ORAL
  Filled 2018-12-28: qty 1

## 2018-12-28 MED ORDER — ATORVASTATIN CALCIUM 20 MG PO TABS
20.0000 mg | ORAL_TABLET | Freq: Every evening | ORAL | Status: DC
  Administered 2018-12-28 – 2018-12-29 (×2): 20 mg via ORAL
  Filled 2018-12-28: qty 2
  Filled 2018-12-28: qty 1

## 2018-12-28 MED ORDER — MAGNESIUM SULFATE 2 GM/50ML IV SOLN
2.0000 g | Freq: Once | INTRAVENOUS | Status: AC
  Administered 2018-12-28: 2 g via INTRAVENOUS
  Filled 2018-12-28: qty 50

## 2018-12-28 MED ORDER — ALBUTEROL SULFATE (2.5 MG/3ML) 0.083% IN NEBU
5.0000 mg | INHALATION_SOLUTION | Freq: Once | RESPIRATORY_TRACT | Status: AC
  Administered 2018-12-28: 5 mg via RESPIRATORY_TRACT
  Filled 2018-12-28: qty 6

## 2018-12-28 MED ORDER — AZITHROMYCIN 250 MG PO TABS: 250.0000 mg | ORAL_TABLET | Freq: Every day | ORAL | Status: DC

## 2018-12-28 MED ORDER — METHYLPREDNISOLONE SODIUM SUCC 125 MG IJ SOLR
125.0000 mg | Freq: Once | INTRAMUSCULAR | Status: AC
  Administered 2018-12-28: 125 mg via INTRAVENOUS
  Filled 2018-12-28: qty 2

## 2018-12-28 MED ORDER — AMLODIPINE BESYLATE 5 MG PO TABS
10.0000 mg | ORAL_TABLET | Freq: Every evening | ORAL | Status: DC
  Administered 2018-12-28 – 2018-12-29 (×2): 10 mg via ORAL
  Filled 2018-12-28 (×2): qty 2

## 2018-12-28 MED ORDER — ALBUTEROL SULFATE (2.5 MG/3ML) 0.083% IN NEBU
2.5000 mg | INHALATION_SOLUTION | Freq: Once | RESPIRATORY_TRACT | Status: AC
  Administered 2018-12-28: 2.5 mg via RESPIRATORY_TRACT
  Filled 2018-12-28: qty 3

## 2018-12-28 MED ORDER — METHYLPREDNISOLONE SODIUM SUCC 125 MG IJ SOLR
INTRAMUSCULAR | Status: AC
  Filled 2018-12-28: qty 2

## 2018-12-28 MED ORDER — ADULT MULTIVITAMIN W/MINERALS CH
1.0000 | ORAL_TABLET | Freq: Every day | ORAL | Status: DC
  Administered 2018-12-29 – 2018-12-30 (×2): 1 via ORAL
  Filled 2018-12-28 (×6): qty 1

## 2018-12-28 MED ORDER — SODIUM CHLORIDE 0.9% FLUSH: 3.0000 mL | INTRAVENOUS | Status: DC | PRN

## 2018-12-28 NOTE — ED Notes (Addendum)
Gave patient meal tray as requested and approved by Dr Estell Harpin. Patient was on cell phone speaking with step-daughter and advised her that she is being admitted.

## 2018-12-28 NOTE — ED Notes (Signed)
Pt ambulated well. O2 sats stayed at 94 and 95% with no oxygen.

## 2018-12-28 NOTE — H&P (Signed)
History and Physical    Haley Burgess BPZ:[id number] DOB: 1976/10/02 DOA: 12/28/2018  PCP: Bridgette Habermann, FNP  Patient coming from: home  Chief Complaint:  wheezing  HPI: Haley Burgess is a 43 y.o. female with medical history significant of copd, dm comes in with worsening sob and wheezing on oral predisone and azithro since wed and not better.  No le edema .  No fevers.  Received several nebs in ed and wheezing better.  No n/v.  Pt referred for admit for copde.  Review of Systems: As per HPI otherwise 10 point review of systems negative.   Past Medical History:  Diagnosis Date  . Anemia   . BMI 50.0-59.9, adult (HCC)   . Bronchitis   . COPD (chronic obstructive pulmonary disease) (HCC)    per pt has oxygen at home if needed like if power were out and can not use her cpap  . Diabetic neuropathy (HCC)    legs, feet, hands  . DOE (dyspnea on exertion)   . Dysuria   . Frequency of urination   . History of sepsis 04/19/2018   due to pyelonephritis  . Hormone imbalance    per pt was told has some type of hormone imbalance that has caused no menstral cycle and facial hair  . Hypertension   . Non-functioning kidney    right  . OSA on CPAP   . Renal insufficiency   . Type 2 diabetes mellitus (HCC)    followed by pcp  . Urgency of urination   . Wears glasses   . Wears partial dentures    upper and lower but does not wear all the time    Past Surgical History:  Procedure Laterality Date  . CYSTOSCOPY Right 07/25/2018   Procedure: CYSTOSCOPY WITH STENT REMOVAL ;  Surgeon: Malen Gauze, MD;  Location: [facility];  Service: Urology;  Laterality: Right;  . CYSTOSCOPY WITH STENT PLACEMENT Right 04/19/2018   Procedure: CYSTOSCOPY WITH STENT PLACEMENT;  Surgeon: Bjorn Pippin, MD;  Location: [facility];  Service: Urology;  Laterality: Right;  . ROBOT ASSISTED LAPAROSCOPIC NEPHRECTOMY Right 07/25/2018   Procedure: XI ROBOTIC ASSISTED LAPAROSCOPIC NEPHRECTOMY;  Surgeon: Malen Gauze, MD;  Location: [facility];  Service: Urology;  Laterality: Right;     reports that she quit smoking about 8 years ago. Her smoking use included cigarettes. She quit after 11.00 years of use. She has never used smokeless tobacco. She reports current alcohol use. She reports that she does not use drugs.  Allergies  Allergen Reactions  . Lortab [Hydrocodone-Acetaminophen] Other (See Comments)    Bad headache  . Latex Rash    Family History  Problem Relation Age of Onset  . Bladder Cancer Mother   . Skin cancer Father   . Skin cancer Sister     Prior to Admission medications   Medication Sig Start Date End Date Taking? Authorizing Provider  albuterol (PROVENTIL) (2.5 MG/3ML) 0.083% nebulizer solution Take 2.5 mg by nebulization every 6 (six) hours as needed for wheezing or shortness of breath.  09/24/18  Yes [provider]  Albuterol Sulfate 108 (90 Base) MCG/ACT AEPB Inhale 1-2 puffs into the lungs every 6 (six) hours as needed (for shortness of breath/wheezing).    Yes [provider]  amLODipine (NORVASC) 10 MG tablet Take 10 mg by mouth every evening. 09/29/18  Yes [provider]  atorvastatin (LIPITOR) 20 MG tablet Take 20 mg by mouth every evening.    Yes [provider]  azithromycin (ZITHROMAX) 250 MG tablet Take 1 tablet (250 mg total) by mouth daily. Take 1 every day until finished. 12/25/18  Yes Mesner, Barbara Cower, MD  budesonide-formoterol [facility]) 160-4.5 MCG/ACT inhaler Inhale 2 puffs into the lungs 2 (two) times daily. Per pt only using as needed   Yes [provider]  fluticasone (FLONASE) 50 MCG/ACT nasal spray Place 1 spray into both nostrils every evening.    Yes [provider]  furosemide (LASIX) 40 MG tablet Take 40 mg by mouth daily as needed for fluid or edema.   Yes [provider]  gabapentin (NEURONTIN) 300 MG capsule Take 300 mg by mouth 3 (three) times daily.   Yes [provider]  glipiZIDE  (GLUCOTROL) 5 MG tablet Take 5 mg by mouth every evening.    Yes [provider]  hydrOXYzine (ATARAX/VISTARIL) 25 MG tablet Take 25 mg by mouth at bedtime.    Yes [provider]  losartan (COZAAR) 25 MG tablet Take 25 mg by mouth every evening. 10/27/18  Yes [provider]  Multiple Vitamins-Minerals (MULTIVITAMIN WITH MINERALS) tablet Take 1 tablet by mouth daily.   Yes [provider]  omega-3 fish oil (MAXEPA) 1000 MG CAPS capsule Take 1 capsule by mouth 2 (two) times daily. 10/22/18  Yes [provider]  OVER THE COUNTER MEDICATION Take 2 tablets by mouth every evening. BEET ROOT   Yes [provider]  predniSONE (DELTASONE) 20 MG tablet 2 tabs po daily x 4 days 12/25/18  Yes Mesner, Barbara Cower, MD  TURMERIC PO Take 1 tablet by mouth every evening.   Yes [provider]    Physical Exam: Vitals:   12/28/18 1900 12/28/18 1930 12/28/18 2000 12/28/18 2030  BP: 124/71 (!) 123/52 101/70 128/72  Pulse: 82 79 81 85  Resp: 20 20  20   Temp:      TempSrc:      SpO2: 98% 97% 92% 97%  Weight:      Height:          Constitutional: NAD, calm, comfortable, morbid obesity Vitals:   12/28/18 1900 12/28/18 1930 12/28/18 2000 12/28/18 2030  BP: 124/71 (!) 123/52 101/70 128/72  Pulse: 82 79 81 85  Resp: 20 20  20   Temp:      TempSrc:      SpO2: 98% 97% 92% 97%  Weight:      Height:       Eyes: PERRL, lids and conjunctivae normal ENMT: Mucous membranes are moist. Posterior pharynx clear of any exudate or lesions.Normal dentition.  Neck: normal, supple, no masses, no thyromegaly Respiratory: clear to auscultation bilaterally, no wheezing, no crackles. Normal respiratory effort. No accessory muscle use.  Cardiovascular: Regular rate and rhythm, no murmurs / rubs / gallops. No extremity edema. 2+ pedal pulses. No carotid bruits.  Abdomen: no tenderness, no masses palpated. No hepatosplenomegaly. Bowel sounds positive.  Musculoskeletal:  no clubbing / cyanosis. No joint deformity upper and lower extremities. Good ROM, no contractures. Normal muscle tone.  Skin: no rashes, lesions, ulcers. No induration Neurologic: CN 2-12 grossly intact. Sensation intact, DTR normal. Strength 5/5 in all 4.  Psychiatric: Normal judgment and insight. Alert and oriented x 3. Normal mood.    Labs on Admission: I have personally reviewed following labs and imaging studies  CBC: Recent Labs  Lab 12/28/18 1249  WBC 5.9  NEUTROABS 3.9  HGB 12.4  HCT 38.4  MCV 91.6  PLT 191   Basic Metabolic Panel: Recent Labs  Lab 12/28/18 1249  NA 139  K 3.8  CL 106  CO2 24  GLUCOSE 190*  BUN 24*  CREATININE 0.91  CALCIUM 8.4*   GFR: Estimated Creatinine Clearance: 108.1 mL/min (by C-G formula based on SCr of 0.91 mg/dL). Liver Function Tests: Recent Labs  Lab 12/28/18 1249  AST 27  ALT 32  ALKPHOS 75  BILITOT 0.7  PROT 6.3*  ALBUMIN 3.4*   No results for input(s): LIPASE, AMYLASE in the last 168 hours. No results for input(s): AMMONIA in the last 168 hours. Coagulation Profile: No results for input(s): INR, PROTIME in the last 168 hours. Cardiac Enzymes: Recent Labs  Lab 12/28/18 1253  TROPONINI <0.03   BNP (last 3 results) No results for input(s): PROBNP in the last 8760 hours. HbA1C: No results for input(s): HGBA1C in the last 72 hours. CBG: No results for input(s): GLUCAP in the last 168 hours. Lipid Profile: No results for input(s): CHOL, HDL, LDLCALC, TRIG, CHOLHDL, LDLDIRECT in the last 72 hours. Thyroid Function Tests: No results for input(s): TSH, T4TOTAL, FREET4, T3FREE, THYROIDAB in the last 72 hours. Anemia Panel: No results for input(s): VITAMINB12, FOLATE, FERRITIN, TIBC, IRON, RETICCTPCT in the last 72 hours. Urine analysis:    Component Value Date/Time   COLORURINE YELLOW 10/08/2018 1239   APPEARANCEUR CLEAR 10/08/2018 1239   LABSPEC 1.014 10/08/2018 1239   PHURINE 6.0 10/08/2018 1239   GLUCOSEU  NEGATIVE 10/08/2018 1239   HGBUR NEGATIVE 10/08/2018 1239   BILIRUBINUR NEGATIVE 10/08/2018 1239   KETONESUR NEGATIVE 10/08/2018 1239   PROTEINUR NEGATIVE 10/08/2018 1239   NITRITE NEGATIVE 10/08/2018 1239   LEUKOCYTESUR MODERATE (A) 10/08/2018 1239   Sepsis Labs: !!!!!!!!!!!!!!!!!!!!!!!!!!!!!!!!!!!!!!!!!!!! (procalcitonin:4,lacticidven:4) )No results found for this or any previous visit (from the past 240 hour(s)).   Radiological Exams on Admission: Dg Chest 2 View  Result Date: 12/28/2018 CLINICAL DATA:  Patient with shortness of breath. Clinical and congestion. EXAM: CHEST - 2 VIEW COMPARISON:  Lesion (2020 FINDINGS: On the monitoring leads overlie the patient. Stable enlarged cardiac and mediastinal contours. Bilateral interstitial pulmonary opacities. No pleural effusion or pneumothorax. IMPRESSION: Cardiomegaly. Interstitial opacities which may represent mild edema Electronically Signed   By: Annia Belt M.D.   On: 12/28/2018 14:05   Old chart reviewed Case discussed with edp  Assessment/Plan 43 yo female with copde Principal Problem:   COPD exacerbation (HCC)- iv solumedrol.  freq nebs.  Cont zpack. afvss  Active Problems:   Hypertension- cont home meds   Diabetes mellitus without complication (HCC)- cont home meds     DVT prophylaxis: scds Code Status: full Family Communication: none Disposition Plan: tomorrow Consults called:  none Admission status:  observation   Vickey Boak A MD [facility]  If 7PM-7AM, please contact night-coverage [email or web address] Password [facility]  12/28/2018, 10:47 PM

## 2018-12-28 NOTE — ED Triage Notes (Signed)
Patient c/o shortness of breath with congested cough, and runny nose. Patient states seen here on Wednesday and diagnosed with bronchitis. Patient given prednisone and antibiotics in which she states today is her last day. Patient instructed to come back to ED if she got worse before seeing PCP. Patient states PCP appointment Monday. Patient using inhalers and breathing treatments at home with no relief. Patient denies having a fever since Wednesday.

## 2018-12-28 NOTE — ED Provider Notes (Signed)
[facility] Provider Note   CSN: [id number] Arrival date & time: 12/28/18  1154    History   Chief Complaint Chief Complaint  Patient presents with  . Shortness of Breath    HPI Philippa SicksBetsy Perman is a 43 y.o. female.     Patient complains of cough and shortness of breath.  She was seen here few days ago and was treated with prednisone and doxycycline.  Patient states she seems to be getting worse with more wheezing and shortness of breath  The history is provided by the patient. No language interpreter was used.  Shortness of Breath  Severity:  Moderate Onset quality:  Sudden Timing:  Constant Progression:  Worsening Chronicity:  Recurrent Context: activity   Relieved by:  Nothing Worsened by:  Nothing Ineffective treatments:  Inhaler Associated symptoms: no abdominal pain, no chest pain, no cough, no headaches and no rash     Past Medical History:  Diagnosis Date  . Anemia   . BMI 50.0-59.9, adult (HCC)   . Bronchitis   . COPD (chronic obstructive pulmonary disease) (HCC)    per pt has oxygen at home if needed like if power were out and can not use her cpap  . Diabetic neuropathy (HCC)    legs, feet, hands  . DOE (dyspnea on exertion)   . Dysuria   . Frequency of urination   . History of sepsis 04/19/2018   due to pyelonephritis  . Hormone imbalance    per pt was told has some type of hormone imbalance that has caused no menstral cycle and facial hair  . Hypertension   . Non-functioning kidney    right  . OSA on CPAP   . Renal insufficiency   . Type 2 diabetes mellitus (HCC)    followed by pcp  . Urgency of urination   . Wears glasses   . Wears partial dentures    upper and lower but does not wear all the time    Patient Active Problem List   Diagnosis Date Noted  . COPD exacerbation (HCC) 12/28/2018  . 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 Sepsis due to undetermined organism (HCC) 04/20/2018  . Acute  pyelonephritis 04/20/2018  . AKI (acute kidney injury) (HCC)   . Hydronephrosis due to obstruction of ureter 04/19/2018  . Acute renal injury (HCC) 04/19/2018  . Acute lower UTI 04/19/2018  . Lactic acidosis 04/19/2018  . Hypertension   . Diabetes mellitus without complication (HCC)   . COPD (chronic obstructive pulmonary disease) (HCC)     Past Surgical History:  Procedure Laterality Date  . CYSTOSCOPY Right 07/25/2018   Procedure: CYSTOSCOPY WITH STENT REMOVAL ;  Surgeon: Malen GauzeMcKenzie, Patrick L, MD;  Location: [facility];  Service: Urology;  Laterality: Right;  . CYSTOSCOPY WITH STENT PLACEMENT Right 04/19/2018   Procedure: CYSTOSCOPY WITH STENT PLACEMENT;  Surgeon: Bjorn PippinWrenn, John, MD;  Location: [facility];  Service: Urology;  Laterality: Right;  . ROBOT ASSISTED LAPAROSCOPIC NEPHRECTOMY Right 07/25/2018   Procedure: XI ROBOTIC ASSISTED LAPAROSCOPIC NEPHRECTOMY;  Surgeon: Malen GauzeMcKenzie, Patrick L, MD;  Location: [facility];  Service: Urology;  Laterality: Right;     OB History   No obstetric history on file.      Home Medications    Prior to Admission medications   Medication Sig Start Date End Date Taking? Authorizing Provider  albuterol (PROVENTIL) (2.5 MG/3ML) 0.083% nebulizer solution Take 2.5 mg by nebulization every 6 (six) hours as needed for wheezing or shortness of breath.  09/24/18  Yes [provider]  Albuterol Sulfate 108 (90 Base) MCG/ACT AEPB Inhale 1-2 puffs into the lungs every 6 (six) hours as needed (for shortness of breath/wheezing).    Yes [provider]  amLODipine (NORVASC) 10 MG tablet Take 10 mg by mouth every evening. 09/29/18  Yes [provider]  atorvastatin (LIPITOR) 20 MG tablet Take 20 mg by mouth every evening.    Yes [provider]  azithromycin (ZITHROMAX) 250 MG tablet Take 1 tablet (250 mg total) by mouth daily. Take 1 every day until finished. 12/25/18  Yes Mesner, Barbara Cower, MD  budesonide-formoterol [facility]) 160-4.5 MCG/ACT  inhaler Inhale 2 puffs into the lungs 2 (two) times daily. Per pt only using as needed   Yes [provider]  fluticasone (FLONASE) 50 MCG/ACT nasal spray Place 1 spray into both nostrils every evening.    Yes [provider]  furosemide (LASIX) 40 MG tablet Take 40 mg by mouth daily as needed for fluid or edema.   Yes [provider]  gabapentin (NEURONTIN) 300 MG capsule Take 300 mg by mouth 3 (three) times daily.   Yes [provider]  glipiZIDE (GLUCOTROL) 5 MG tablet Take 5 mg by mouth every evening.    Yes [provider]  hydrOXYzine (ATARAX/VISTARIL) 25 MG tablet Take 25 mg by mouth at bedtime.    Yes [provider]  losartan (COZAAR) 25 MG tablet Take 25 mg by mouth every evening. 10/27/18  Yes [provider]  Multiple Vitamins-Minerals (MULTIVITAMIN WITH MINERALS) tablet Take 1 tablet by mouth daily.   Yes [provider]  omega-3 fish oil (MAXEPA) 1000 MG CAPS capsule Take 1 capsule by mouth 2 (two) times daily. 10/22/18  Yes [provider]  OVER THE COUNTER MEDICATION Take 2 tablets by mouth every evening. BEET ROOT   Yes [provider]  predniSONE (DELTASONE) 20 MG tablet 2 tabs po daily x 4 days 12/25/18  Yes Mesner, Barbara Cower, MD  TURMERIC PO Take 1 tablet by mouth every evening.   Yes [provider]    Family History Family History  Problem Relation Age of Onset  . Bladder Cancer Mother   . Skin cancer Father   . Skin cancer Sister     Social History Social History   Tobacco Use  . Smoking status: Former Smoker    Years: 11.00    Types: Cigarettes    Last attempt to quit: 04/19/2010    Years since quitting: 8.6  . Smokeless tobacco: Never Used  Substance Use Topics  . Alcohol use: Yes    Comment: seldom  . Drug use: Never     Allergies   Lortab [hydrocodone-acetaminophen] and Latex   Review of Systems Review of Systems  Constitutional: Negative for appetite change  and fatigue.  HENT: Negative for congestion, ear discharge and sinus pressure.   Eyes: Negative for discharge.  Respiratory: Positive for shortness of breath. Negative for cough.   Cardiovascular: Negative for chest pain.  Gastrointestinal: Negative for abdominal pain and diarrhea.  Genitourinary: Negative for frequency and hematuria.  Musculoskeletal: Negative for back pain.  Skin: Negative for rash.  Neurological: Negative for seizures and headaches.  Psychiatric/Behavioral: Negative for hallucinations.     Physical Exam Updated Vital Signs BP (!) 123/52   Pulse 79   Temp 98.6 F (37 C) (Oral)   Resp 20   Ht  (1.6 m)   Wt (!) 140.6 kg   SpO2 97%  BMI 54.91 kg/m   Physical Exam Vitals signs and nursing note reviewed.  Constitutional:      Appearance: She is well-developed.  HENT:     Head: Normocephalic.  Eyes:     General: No scleral icterus.    Conjunctiva/sclera: Conjunctivae normal.  Neck:     Musculoskeletal: Neck supple.     Thyroid: No thyromegaly.  Cardiovascular:     Rate and Rhythm: Normal rate and regular rhythm.     Heart sounds: No murmur. No friction rub. No gallop.   Pulmonary:     Breath sounds: No stridor. Wheezing present. No rales.  Chest:     Chest wall: No tenderness.  Abdominal:     General: There is no distension.     Tenderness: There is no abdominal tenderness. There is no rebound.  Musculoskeletal: Normal range of motion.  Lymphadenopathy:     Cervical: No cervical adenopathy.  Skin:    Findings: No erythema or rash.  Neurological:     Mental Status: She is alert and oriented to person, place, and time.     Motor: No abnormal muscle tone.     Coordination: Coordination normal.  Psychiatric:        Behavior: Behavior normal.      ED Treatments / Results  Labs (all labs ordered are listed, but only abnormal results are displayed) Labs Reviewed  CBC WITH DIFFERENTIAL/PLATELET - Abnormal; Notable for the following  components:      Result Value   RDW 16.3 (*)    All other components within normal limits  COMPREHENSIVE METABOLIC PANEL - Abnormal; Notable for the following components:   Glucose, Bld 190 (*)    BUN 24 (*)    Calcium 8.4 (*)    Total Protein 6.3 (*)    Albumin 3.4 (*)    All other components within normal limits  RESPIRATORY PANEL BY PCR  BRAIN NATRIURETIC PEPTIDE  TROPONIN I    EKG None  Radiology Dg Chest 2 View  Result Date: 12/28/2018 CLINICAL DATA:  Patient with shortness of breath. Clinical and congestion. EXAM: CHEST - 2 VIEW COMPARISON:  Lesion (2020 FINDINGS: On the monitoring leads overlie the patient. Stable enlarged cardiac and mediastinal contours. Bilateral interstitial pulmonary opacities. No pleural effusion or pneumothorax. IMPRESSION: Cardiomegaly. Interstitial opacities which may represent mild edema Electronically Signed   By: Annia Belt M.D.   On: 12/28/2018 14:05    Procedures Procedures (including critical care time)  Medications Ordered in ED Medications  albuterol (PROVENTIL) (2.5 MG/3ML) 0.083% nebulizer solution 5 mg (5 mg Nebulization Given 12/28/18 1255)  ipratropium-albuterol (DUONEB) 0.5-2.5 (3) MG/3ML nebulizer solution 3 mL (3 mLs Nebulization Given 12/28/18 1255)  methylPREDNISolone sodium succinate (SOLU-MEDROL) 125 mg/2 mL injection 125 mg (125 mg Intravenous Given 12/28/18 1251)  magnesium sulfate IVPB 2 g 50 mL ( Intravenous Stopped 12/28/18 1405)  ipratropium-albuterol (DUONEB) 0.5-2.5 (3) MG/3ML nebulizer solution 3 mL (3 mLs Nebulization Given 12/28/18 1536)  albuterol (PROVENTIL) (2.5 MG/3ML) 0.083% nebulizer solution 2.5 mg (2.5 mg Nebulization Given 12/28/18 1536)     Initial Impression / Assessment and Plan / ED Course  I have reviewed the triage vital signs and the nursing notes.  Pertinent labs & imaging results that were available during my care of the patient were reviewed by me and considered in my medical decision making (see  chart for details).        Patient with exacerbation of COPD with upper respiratory infection.  While patient was ambulating  she became extremely short of breath.  She will be admitted for COPD exacerbation  Final Clinical Impressions(s) / ED Diagnoses   Final diagnoses:  COPD exacerbation [facility])    ED Discharge Orders    None       Bethann Berkshire, MD 12/28/18 2025

## 2018-12-28 NOTE — ED Notes (Signed)
Patient's step-daughter, Kriste Basque, called inquiring about patient and requesting food for patient. Wants to be called when decided if patient will be admitted or not. Number is [phone].

## 2018-12-29 DIAGNOSIS — J441 Chronic obstructive pulmonary disease with (acute) exacerbation: Secondary | ICD-10-CM | POA: Diagnosis not present

## 2018-12-29 DIAGNOSIS — Z9989 Dependence on other enabling machines and devices: Secondary | ICD-10-CM

## 2018-12-29 DIAGNOSIS — E119 Type 2 diabetes mellitus without complications: Secondary | ICD-10-CM

## 2018-12-29 DIAGNOSIS — I10 Essential (primary) hypertension: Secondary | ICD-10-CM

## 2018-12-29 DIAGNOSIS — Z6841 Body Mass Index (BMI) 40.0 and over, adult: Secondary | ICD-10-CM

## 2018-12-29 DIAGNOSIS — G4733 Obstructive sleep apnea (adult) (pediatric): Secondary | ICD-10-CM

## 2018-12-29 LAB — RESPIRATORY PANEL BY PCR
Adenovirus: NOT DETECTED
Bordetella pertussis: NOT DETECTED
CORONAVIRUS OC43-RVPPCR: NOT DETECTED
Chlamydophila pneumoniae: NOT DETECTED
Coronavirus 229E: NOT DETECTED
Coronavirus HKU1: NOT DETECTED
Coronavirus NL63: NOT DETECTED
Influenza A: NOT DETECTED
Influenza B: NOT DETECTED
METAPNEUMOVIRUS-RVPPCR: DETECTED — AB
Mycoplasma pneumoniae: NOT DETECTED
PARAINFLUENZA VIRUS 1-RVPPCR: NOT DETECTED
Parainfluenza Virus 2: NOT DETECTED
Parainfluenza Virus 3: NOT DETECTED
Parainfluenza Virus 4: NOT DETECTED
Respiratory Syncytial Virus: NOT DETECTED
Rhinovirus / Enterovirus: NOT DETECTED

## 2018-12-29 LAB — CBC WITH DIFFERENTIAL/PLATELET
Abs Immature Granulocytes: 0.03 10*3/uL (ref 0.00–0.07)
BASOS PCT: 0 %
Basophils Absolute: 0 10*3/uL (ref 0.0–0.1)
Eosinophils Absolute: 0 10*3/uL (ref 0.0–0.5)
Eosinophils Relative: 0 %
HCT: 40.3 % (ref 36.0–46.0)
Hemoglobin: 12.9 g/dL (ref 12.0–15.0)
Immature Granulocytes: 1 %
Lymphocytes Relative: 15 %
Lymphs Abs: 0.8 10*3/uL (ref 0.7–4.0)
MCH: 29.5 pg (ref 26.0–34.0)
MCHC: 32 g/dL (ref 30.0–36.0)
MCV: 92.2 fL (ref 80.0–100.0)
Monocytes Absolute: 0.1 10*3/uL (ref 0.1–1.0)
Monocytes Relative: 2 %
NEUTROS ABS: 4.4 10*3/uL (ref 1.7–7.7)
Neutrophils Relative %: 82 %
PLATELETS: 214 10*3/uL (ref 150–400)
RBC: 4.37 MIL/uL (ref 3.87–5.11)
RDW: 15.7 % — ABNORMAL HIGH (ref 11.5–15.5)
WBC: 5.4 10*3/uL (ref 4.0–10.5)
nRBC: 0 % (ref 0.0–0.2)

## 2018-12-29 LAB — HEMOGLOBIN A1C
Hgb A1c MFr Bld: 6.9 % — ABNORMAL HIGH (ref 4.8–5.6)
Mean Plasma Glucose: 151.33 mg/dL

## 2018-12-29 LAB — BASIC METABOLIC PANEL
Anion gap: 10 (ref 5–15)
BUN: 24 mg/dL — ABNORMAL HIGH (ref 6–20)
CO2: 22 mmol/L (ref 22–32)
Calcium: 8.8 mg/dL — ABNORMAL LOW (ref 8.9–10.3)
Chloride: 109 mmol/L (ref 98–111)
Creatinine, Ser: 1.04 mg/dL — ABNORMAL HIGH (ref 0.44–1.00)
GFR calc Af Amer: >60 mL/min (ref >60)
Glucose, Bld: 219 mg/dL — ABNORMAL HIGH (ref 70–99)
Potassium: 4.8 mmol/L (ref 3.5–5.1)
Sodium: 141 mmol/L (ref 135–145)

## 2018-12-29 LAB — GLUCOSE, CAPILLARY
Glucose-Capillary: 290 mg/dL — ABNORMAL HIGH (ref 70–99)
Glucose-Capillary: 248 mg/dL — ABNORMAL HIGH (ref 70–99)

## 2018-12-29 MED ORDER — ENOXAPARIN SODIUM 60 MG/0.6ML ~~LOC~~ SOLN
60.0000 mg | SUBCUTANEOUS | Status: DC
  Administered 2018-12-29: 60 mg via SUBCUTANEOUS
  Filled 2018-12-29: qty 0.6

## 2018-12-29 MED ORDER — INSULIN ASPART 100 UNIT/ML ~~LOC~~ SOLN
0.0000 [IU] | Freq: Three times a day (TID) | SUBCUTANEOUS | Status: DC
  Administered 2018-12-29: 7 [IU] via SUBCUTANEOUS
  Administered 2018-12-29: 11 [IU] via SUBCUTANEOUS
  Administered 2018-12-30: 7 [IU] via SUBCUTANEOUS
  Administered 2018-12-30: 4 [IU] via SUBCUTANEOUS

## 2018-12-29 MED ORDER — DM-GUAIFENESIN ER 30-600 MG PO TB12
1.0000 | ORAL_TABLET | Freq: Two times a day (BID) | ORAL | Status: DC
  Administered 2018-12-29 – 2018-12-30 (×3): 1 via ORAL
  Filled 2018-12-29 (×3): qty 1

## 2018-12-29 MED ORDER — IPRATROPIUM-ALBUTEROL 0.5-2.5 (3) MG/3ML IN SOLN
3.0000 mL | Freq: Three times a day (TID) | RESPIRATORY_TRACT | Status: DC
  Administered 2018-12-30: 3 mL via RESPIRATORY_TRACT
  Filled 2018-12-29 (×2): qty 3

## 2018-12-29 MED ORDER — INSULIN ASPART 100 UNIT/ML ~~LOC~~ SOLN
0.0000 [IU] | Freq: Every day | SUBCUTANEOUS | Status: DC
  Administered 2018-12-29: 2 [IU] via SUBCUTANEOUS

## 2018-12-29 MED ORDER — BUDESONIDE 0.5 MG/2ML IN SUSP
0.5000 mg | Freq: Two times a day (BID) | RESPIRATORY_TRACT | Status: DC
  Administered 2018-12-29 – 2018-12-30 (×3): 0.5 mg via RESPIRATORY_TRACT
  Filled 2018-12-29 (×3): qty 2

## 2018-12-29 MED ORDER — DOXYCYCLINE HYCLATE 100 MG PO TABS
100.0000 mg | ORAL_TABLET | Freq: Two times a day (BID) | ORAL | Status: DC
  Administered 2018-12-29 – 2018-12-30 (×3): 100 mg via ORAL
  Filled 2018-12-29 (×3): qty 1

## 2018-12-29 NOTE — Progress Notes (Signed)
PROGRESS NOTE    Haley Burgess  GYB:[id number] DOB: 1975-10-18 DOA: 12/28/2018 PCP: Bridgette Habermann, FNP     Brief Narrative:  43 y.o. female with medical history significant of copd, dm comes in with worsening sob and wheezing on oral predisone and azithro since wed and not better.  No le edema .  No fevers.  Received several nebs in ed and wheezing better.  No n/v.  Pt referred for hospitalization for further evaluation and management of COPD exacerbation.  Assessment & Plan: 1-COPD exacerbation (HCC) -Slowly improving -Continue steroids, nebulizer management, Mucinex, flutter valve and doxycycline -Follow clinical response -No requiring oxygen supplementation at rest at this time. -Influenza panel and RVP negative -Chest x-ray without acute infiltrate process.  2-essential hypertension -Continue home antihypertensive regimen -Continue heart healthy diet.  3-type 2 diabetes mellitus in a patient with obesity and hyperlipidemia -Given anticipated elevation in CBGs while using steroids patient will be started on sliding scale insulin -Holding oral hypoglycemic agents -Modified carbohydrate diet ordered. -A1c 6.9  4-hyperlipidemia -Continue statins  5-morbid obesity with underlying history of obstructive sleep apnea -Body mass index is 54.91 kg/m. -Low calorie diet, portion control and increase physical activity discussed with patient. -Continue the use of CPAP nightly -Most likely with component of OHS.  6-Diabetic neuropathy -Continue gabapentin   DVT prophylaxis: Lovenox Code Status: Full code Family Communication: Stepdaughter over the phone Disposition Plan: Continue treatment for COPD exacerbation, continue nightly CPAP; hopefully discharge home in the next 24-48 hours after a stabilization in her respiratory status.  Consultants:   None  Procedures:   See below for x-ray reports.  Antimicrobials:  Anti-infectives (From admission, onward)   Start      Dose/Rate Route Frequency Ordered Stop   12/29/18 1000  azithromycin (ZITHROMAX) tablet 250 mg  Status:  Discontinued    Note to Pharmacy:  Take 1 every day until finished.     250 mg Oral Daily 12/28/18 2252 12/29/18 0841   12/29/18 1000  doxycycline (VIBRA-TABS) tablet 100 mg     100 mg Oral Every 12 hours 12/29/18 0841         Subjective: Morbidly obese in no major distress.  Reporting shortness of breath especially with exertion and having mild difficulty speaking in full sentences.  Patient denies chest pain, nausea, vomiting or fever.  Objective: Vitals:   12/29/18 1019 12/29/18 1338 12/29/18 1556 12/29/18 1613  BP: (!) 148/73   129/75  Pulse: 76  75 90  Resp: 20  14 16   Temp: 97.7 F (36.5 C)   97.7 F (36.5 C)  TempSrc: Oral   Oral  SpO2: 92% 93% 94% 97%  Weight: (!) 140.6 kg     Height: 5\' 3"  (1.6 m)       Intake/Output Summary (Last 24 hours) at 12/29/2018 1649 Last data filed at 12/29/2018 1020 Gross per 24 hour  Intake -  Output 1 ml  Net -1 ml   Filed Weights   12/28/18 1204 12/29/18 1019  Weight: (!) 140.6 kg (!) 140.6 kg    Examination: General exam: Alert, awake, oriented x 3; reporting feeling short of breath and having mild difficulty speaking in full sentences.  No oxygen supplementation required while resting.  Patient denies chest pain, nausea or vomiting. Respiratory system: Fair air movement, positive rhonchi and expiratory wheezing; no using accessory muscles. Cardiovascular system:RRR. No murmurs, rubs, gallops. Gastrointestinal system: Abdomen is obese, nondistended, soft and nontender. No organomegaly or masses felt. Normal bowel sounds heard. Central nervous  system: Alert and oriented. No focal neurological deficits. Extremities: No cyanosis or clubbing; trace-1+ edema bilaterally. Skin: No rashes, lesions or ulcers Psychiatry: Judgement and insight appear normal. Mood & affect appropriate.     Data Reviewed: I have personally reviewed  following labs and imaging studies  CBC: Recent Labs  Lab 12/28/18 1249 12/29/18 0443  WBC 5.9 5.4  NEUTROABS 3.9 4.4  HGB 12.4 12.9  HCT 38.4 40.3  MCV 91.6 92.2  PLT 191 214   Basic Metabolic Panel: Recent Labs  Lab 12/28/18 1249 12/29/18 0443  NA 139 141  K 3.8 4.8  CL 106 109  CO2 24 22  GLUCOSE 190* 219*  BUN 24* 24*  CREATININE 0.91 1.04*  CALCIUM 8.4* 8.8*   GFR: Estimated Creatinine Clearance: 94.6 mL/min (A) (by C-G formula based on SCr of 1.04 mg/dL (H)).   Liver Function Tests: Recent Labs  Lab 12/28/18 1249  AST 27  ALT 32  ALKPHOS 75  BILITOT 0.7  PROT 6.3*  ALBUMIN 3.4*   Cardiac Enzymes: Recent Labs  Lab 12/28/18 1253  TROPONINI <0.03   HbA1C: Recent Labs    12/29/18 0949  HGBA1C 6.9*   CBG: Recent Labs  Lab 12/29/18 1152  GLUCAP 290*   Urine analysis:    Component Value Date/Time   COLORURINE YELLOW 10/08/2018 1239   APPEARANCEUR CLEAR 10/08/2018 1239   LABSPEC 1.014 10/08/2018 1239   PHURINE 6.0 10/08/2018 1239   GLUCOSEU NEGATIVE 10/08/2018 1239   HGBUR NEGATIVE 10/08/2018 1239   BILIRUBINUR NEGATIVE 10/08/2018 1239   KETONESUR NEGATIVE 10/08/2018 1239   PROTEINUR NEGATIVE 10/08/2018 1239   NITRITE NEGATIVE 10/08/2018 1239   LEUKOCYTESUR MODERATE (A) 10/08/2018 1239    Recent Results (from the past 240 hour(s))  Respiratory Panel by PCR     Status: Abnormal   Collection Time: 12/28/18  8:01 PM  Result Value Ref Range Status   Adenovirus NOT DETECTED NOT DETECTED Final   Coronavirus 229E NOT DETECTED NOT DETECTED Final    Comment: (NOTE) The Coronavirus on the Respiratory Panel, DOES NOT test for the novel  Coronavirus (2019 nCoV)    Coronavirus HKU1 NOT DETECTED NOT DETECTED Final   Coronavirus NL63 NOT DETECTED NOT DETECTED Final   Coronavirus OC43 NOT DETECTED NOT DETECTED Final   Metapneumovirus DETECTED (A) NOT DETECTED Final   Rhinovirus / Enterovirus NOT DETECTED NOT DETECTED Final   Influenza A NOT  DETECTED NOT DETECTED Final   Influenza B NOT DETECTED NOT DETECTED Final   Parainfluenza Virus 1 NOT DETECTED NOT DETECTED Final   Parainfluenza Virus 2 NOT DETECTED NOT DETECTED Final   Parainfluenza Virus 3 NOT DETECTED NOT DETECTED Final   Parainfluenza Virus 4 NOT DETECTED NOT DETECTED Final   Respiratory Syncytial Virus NOT DETECTED NOT DETECTED Final   Bordetella pertussis NOT DETECTED NOT DETECTED Final   Chlamydophila pneumoniae NOT DETECTED NOT DETECTED Final   Mycoplasma pneumoniae NOT DETECTED NOT DETECTED Final    Comment: Performed at [facility], [address] [place], [place] [address]     Radiology Studies: Dg Chest 2 View  Result Date: 12/28/2018 CLINICAL DATA:  Patient with shortness of breath. Clinical and congestion. EXAM: CHEST - 2 VIEW COMPARISON:  Lesion (2020 FINDINGS: On the monitoring leads overlie the patient. Stable enlarged cardiac and mediastinal contours. Bilateral interstitial pulmonary opacities. No pleural effusion or pneumothorax. IMPRESSION: Cardiomegaly. Interstitial opacities which may represent mild edema Electronically Signed   By: Annia Beltrew  Davis M.D.   On:  12/28/2018 14:05   Scheduled Meds: . amLODipine  10 mg Oral QPM  . atorvastatin  20 mg Oral QPM  . budesonide (PULMICORT) nebulizer solution  0.5 mg Nebulization BID  . dextromethorphan-guaiFENesin  1 tablet Oral BID  . doxycycline  100 mg Oral Q12H  . fluticasone  1 spray Each Nare QPM  . gabapentin  300 mg Oral TID  . hydrOXYzine  25 mg Oral QHS  . insulin aspart  0-20 Units Subcutaneous TID WC  . insulin aspart  0-5 Units Subcutaneous QHS  . ipratropium-albuterol  3 mL Nebulization Q6H  . losartan  25 mg Oral QPM  . methylPREDNISolone (SOLU-MEDROL) injection  80 mg Intravenous Q12H  . multivitamin with minerals  1 tablet Oral Daily  . omega-3 acid ethyl esters  1 capsule Oral BID  . sodium chloride flush  3 mL Intravenous Q12H   Continuous Infusions: . sodium chloride        LOS: 0 days    Time spent: 30 minutes    Vassie Loll, MD [facility] Pager [phone]   12/29/2018, 4:49 PM

## 2018-12-29 NOTE — Care Management Obs Status (Signed)
MEDICARE OBSERVATION STATUS NOTIFICATION   Patient Details  Name: Haley Burgess MRN: [id number] Date of Birth: 1976-01-03   Medicare Observation Status Notification Given:  Yes    Annice Needy, LCSW 12/29/2018, 3:36 PM

## 2018-12-29 NOTE — Progress Notes (Signed)
OT Screen Note  Patient Details Name: Haley Burgess MRN: [id number] DOB: [birth year]   Cancelled Treatment:    Reason Eval/Treat Not Completed: OT screened, no needs identified, will sign off. Patient functioning at independent level. No concerns for returning home. No additional OT services needed at discharge. Thank-you.   Limmie Patricia, OTR/L,CBIS  [phone]  12/29/2018, 10:31 AM

## 2018-12-29 NOTE — Plan of Care (Signed)

## 2018-12-29 NOTE — ED Notes (Signed)
ED TO INPATIENT HANDOFF REPORT  ED Nurse Name and Phone #: Thayer Ohm [phone]  S Name/Age/Gender Haley Burgess 43 y.o. female Room/Bed: APA05/APA05  Code Status   Code Status: Full Code  Home/SNF/Other Home Patient oriented to: All Is this baseline? Yes   Triage Complete: Triage complete  Chief Complaint shortness of breath  Triage Note Patient c/o shortness of breath with congested cough, and runny nose. Patient states seen here on Wednesday and diagnosed with bronchitis. Patient given prednisone and antibiotics in which she states today is her last day. Patient instructed to come back to ED if she got worse before seeing PCP. Patient states PCP appointment Monday. Patient using inhalers and breathing treatments at home with no relief. Patient denies having a fever since Wednesday.    Allergies Allergies  Allergen Reactions  . Lortab [Hydrocodone-Acetaminophen] Other (See Comments)    Bad headache  . Latex Rash    Level of Care/Admitting Diagnosis ED Disposition    ED Disposition Condition Comment   Admit  Hospital Area: [facility] [[id number]]  Level of Care: Med-Surg [16]  Diagnosis: COPD exacerbation [facility]) [[id number]]  Admitting Physician: Haydee Monica [[id number]]  Attending Physician: Tarry Kos A [[id number]]  PT Class (Do Not Modify): Observation [104]  PT Acc Code (Do Not Modify): Observation [10022]       B Medical/Surgery History Past Medical History:  Diagnosis Date  . Anemia   . BMI 50.0-59.9, adult (HCC)   . Bronchitis   . COPD (chronic obstructive pulmonary disease) (HCC)    per pt has oxygen at home if needed like if power were out and can not use her cpap  . Diabetic neuropathy (HCC)    legs, feet, hands  . DOE (dyspnea on exertion)   . Dysuria   . Frequency of urination   . History of sepsis 04/19/2018   due to pyelonephritis  . Hormone imbalance    per pt was told has some type of hormone imbalance that has caused no menstral cycle and  facial hair  . Hypertension   . Non-functioning kidney    right  . OSA on CPAP   . Renal insufficiency   . Type 2 diabetes mellitus (HCC)    followed by pcp  . Urgency of urination   . Wears glasses   . Wears partial dentures    upper and lower but does not wear all the time   Past Surgical History:  Procedure Laterality Date  . CYSTOSCOPY Right 07/25/2018   Procedure: CYSTOSCOPY WITH STENT REMOVAL ;  Surgeon: Malen Gauze, MD;  Location: [facility];  Service: Urology;  Laterality: Right;  . CYSTOSCOPY WITH STENT PLACEMENT Right 04/19/2018   Procedure: CYSTOSCOPY WITH STENT PLACEMENT;  Surgeon: Bjorn Pippin, MD;  Location: [facility];  Service: Urology;  Laterality: Right;  . ROBOT ASSISTED LAPAROSCOPIC NEPHRECTOMY Right 07/25/2018   Procedure: XI ROBOTIC ASSISTED LAPAROSCOPIC NEPHRECTOMY;  Surgeon: Malen Gauze, MD;  Location: [facility];  Service: Urology;  Laterality: Right;     A IV Location/Drains/Wounds Patient Lines/Drains/Airways Status   Active Line/Drains/Airways    Name:   Placement date:   Placement time:   Site:   Days:   Peripheral IV 12/28/18 Left Antecubital   12/28/18    1250    Antecubital   1   Incision (Closed) 07/25/18 Abdomen Right   07/25/18    1211     157          Intake/Output Last 24 hours  Intake/Output Summary (Last 24 hours) at 12/29/2018 0840 Last data filed at 12/28/2018 1518 Gross per 24 hour  Intake 51.55 ml  Output -  Net 51.55 ml    Labs/Imaging Results for orders placed or performed during the hospital encounter of 12/28/18 (from the past 48 hour(s))  CBC with Differential/Platelet     Status: Abnormal   Collection Time: 12/28/18 12:49 PM  Result Value Ref Range   WBC 5.9 4.0 - 10.5 K/uL   RBC 4.19 3.87 - 5.11 MIL/uL   Hemoglobin 12.4 12.0 - 15.0 g/dL   HCT 91.6 94.5 - 03.8 %   MCV 91.6 80.0 - 100.0 fL   MCH 29.6 26.0 - 34.0 pg   MCHC 32.3 30.0 - 36.0 g/dL   RDW 88.2 (H) 80.0 - 34.9 %   Platelets 191 150 - 400 K/uL   nRBC  0.0 0.0 - 0.2 %   Neutrophils Relative % 67 %   Neutro Abs 3.9 1.7 - 7.7 K/uL   Lymphocytes Relative 25 %   Lymphs Abs 1.5 0.7 - 4.0 K/uL   Monocytes Relative 7 %   Monocytes Absolute 0.4 0.1 - 1.0 K/uL   Eosinophils Relative 0 %   Eosinophils Absolute 0.0 0.0 - 0.5 K/uL   Basophils Relative 1 %   Basophils Absolute 0.0 0.0 - 0.1 K/uL   Immature Granulocytes 0 %   Abs Immature Granulocytes 0.02 0.00 - 0.07 K/uL    Comment: Performed at [facility], [address], [place], [place] [address]  Comprehensive metabolic panel     Status: Abnormal   Collection Time: 12/28/18 12:49 PM  Result Value Ref Range   Sodium 139 135 - 145 mmol/L   Potassium 3.8 3.5 - 5.1 mmol/L   Chloride 106 98 - 111 mmol/L   CO2 24 22 - 32 mmol/L   Glucose, Bld 190 (H) 70 - 99 mg/dL   BUN 24 (H) 6 - 20 mg/dL   Creatinine, Ser 0.56 0.44 - 1.00 mg/dL   Calcium 8.4 (L) 8.9 - 10.3 mg/dL   Total Protein 6.3 (L) 6.5 - 8.1 g/dL   Albumin 3.4 (L) 3.5 - 5.0 g/dL   AST 27 15 - 41 U/L   ALT 32 0 - 44 U/L   Alkaline Phosphatase 75 38 - 126 U/L   Total Bilirubin 0.7 0.3 - 1.2 mg/dL   GFR calc non Af Amer >60 >60 mL/min   GFR calc Af Amer >60 >60 mL/min   Anion gap 9 5 - 15    Comment: Performed at [facility], [address], [place], [place] [address]  Brain natriuretic peptide     Status: None   Collection Time: 12/28/18 12:49 PM  Result Value Ref Range   B Natriuretic Peptide 19.0 0.0 - 100.0 pg/mL    Comment: Performed at [facility], [address] [place], [place] [address]  Troponin I - ONCE - STAT     Status: None   Collection Time: 12/28/18 12:53 PM  Result Value Ref Range   Troponin I <0.03 <0.03 ng/mL    Comment: Performed at [facility], [address], [place], [place] [address]  Basic metabolic panel     Status: Abnormal   Collection Time: 12/29/18  4:43 AM  Result Value Ref Range   Sodium 141 135 - 145 mmol/L   Potassium 4.8 3.5 - 5.1 mmol/L    Comment: DELTA CHECK NOTED   Chloride 109 98 -  111 mmol/L   CO2 22  22 - 32 mmol/L   Glucose, Bld 219 (H) 70 - 99 mg/dL   BUN 24 (H) 6 - 20 mg/dL   Creatinine, Ser 1.61 (H) 0.44 - 1.00 mg/dL   Calcium 8.8 (L) 8.9 - 10.3 mg/dL   GFR calc non Af Amer >60 >60 mL/min   GFR calc Af Amer >60 >60 mL/min   Anion gap 10 5 - 15    Comment: Performed at [facility], [address], [place], [place] [address]  CBC WITH DIFFERENTIAL     Status: Abnormal   Collection Time: 12/29/18  4:43 AM  Result Value Ref Range   WBC 5.4 4.0 - 10.5 K/uL   RBC 4.37 3.87 - 5.11 MIL/uL   Hemoglobin 12.9 12.0 - 15.0 g/dL   HCT 54.0 98.1 - 19.1 %   MCV 92.2 80.0 - 100.0 fL   MCH 29.5 26.0 - 34.0 pg   MCHC 32.0 30.0 - 36.0 g/dL   RDW 47.8 (H) 29.5 - 62.1 %   Platelets 214 150 - 400 K/uL   nRBC 0.0 0.0 - 0.2 %   Neutrophils Relative % 82 %   Neutro Abs 4.4 1.7 - 7.7 K/uL   Lymphocytes Relative 15 %   Lymphs Abs 0.8 0.7 - 4.0 K/uL   Monocytes Relative 2 %   Monocytes Absolute 0.1 0.1 - 1.0 K/uL   Eosinophils Relative 0 %   Eosinophils Absolute 0.0 0.0 - 0.5 K/uL   Basophils Relative 0 %   Basophils Absolute 0.0 0.0 - 0.1 K/uL   Immature Granulocytes 1 %   Abs Immature Granulocytes 0.03 0.00 - 0.07 K/uL    Comment: Performed at [facility], [address], [place], [place] [address]   Dg Chest 2 View  Result Date: 12/28/2018 CLINICAL DATA:  Patient with shortness of breath. Clinical and congestion. EXAM: CHEST - 2 VIEW COMPARISON:  Lesion (2020 FINDINGS: On the monitoring leads overlie the patient. Stable enlarged cardiac and mediastinal contours. Bilateral interstitial pulmonary opacities. No pleural effusion or pneumothorax. IMPRESSION: Cardiomegaly. Interstitial opacities which may represent mild edema Electronically Signed   By: Annia Belt M.D.   On: 12/28/2018 14:05    Pending Labs Unresulted Labs (From admission, onward)    Start     Ordered   12/28/18 1750  Respiratory Panel by PCR  (Pediatric Respiratory Virus Panel w droplet and contact  precautions)  Once,   R     12/28/18 1749          Vitals/Pain Today's Vitals   12/29/18 0230 12/29/18 0300 12/29/18 0330 12/29/18 0828  BP: (!) 109/44 (!) 146/80 132/72   Pulse: 68 80    Resp:      Temp:      TempSrc:      SpO2: 98% 96%  93%  Weight:      Height:      PainSc:        Isolation Precautions Droplet and Contact precautions  Medications Medications  gabapentin (NEURONTIN) capsule 300 mg (300 mg Oral Given 12/28/18 2353)  furosemide (LASIX) tablet 40 mg (has no administration in time range)  hydrOXYzine (ATARAX/VISTARIL) tablet 25 mg (25 mg Oral Given 12/28/18 2352)  fluticasone (FLONASE) 50 MCG/ACT nasal spray 1 spray (1 spray Each Nare Not Given 12/28/18 2358)  glipiZIDE (GLUCOTROL) tablet 5 mg (5 mg Oral Not Given 12/28/18 2359)  atorvastatin (LIPITOR) tablet 20 mg (20 mg Oral Given 12/28/18 2353)  multivitamin with minerals tablet 1 tablet (has no administration in  time range)  losartan (COZAAR) tablet 25 mg (25 mg Oral Given 12/28/18 2353)  omega-3 acid ethyl esters (LOVAZA) capsule 1 g (1 g Oral Not Given 12/28/18 2359)  amLODipine (NORVASC) tablet 10 mg (10 mg Oral Given 12/28/18 2352)  albuterol (PROVENTIL) (2.5 MG/3ML) 0.083% nebulizer solution 2.5 mg (has no administration in time range)  azithromycin (ZITHROMAX) tablet 250 mg (has no administration in time range)  sodium chloride flush (NS) 0.9 % injection 3 mL (3 mLs Intravenous Given 12/28/18 2355)  sodium chloride flush (NS) 0.9 % injection 3 mL (has no administration in time range)  0.9 %  sodium chloride infusion (has no administration in time range)  ipratropium-albuterol (DUONEB) 0.5-2.5 (3) MG/3ML nebulizer solution 3 mL (3 mLs Nebulization Given 12/29/18 0828)  methylPREDNISolone sodium succinate (SOLU-MEDROL) 125 mg/2 mL injection 80 mg (80 mg Intravenous Not Given 12/29/18 0000)  albuterol (PROVENTIL) (2.5 MG/3ML) 0.083% nebulizer solution 5 mg (5 mg Nebulization Given 12/28/18 1255)   ipratropium-albuterol (DUONEB) 0.5-2.5 (3) MG/3ML nebulizer solution 3 mL (3 mLs Nebulization Given 12/28/18 1255)  methylPREDNISolone sodium succinate (SOLU-MEDROL) 125 mg/2 mL injection 125 mg (125 mg Intravenous Given 12/28/18 1251)  magnesium sulfate IVPB 2 g 50 mL ( Intravenous Stopped 12/28/18 1405)  ipratropium-albuterol (DUONEB) 0.5-2.5 (3) MG/3ML nebulizer solution 3 mL (3 mLs Nebulization Given 12/28/18 1536)  albuterol (PROVENTIL) (2.5 MG/3ML) 0.083% nebulizer solution 2.5 mg (2.5 mg Nebulization Given 12/28/18 1536)    Mobility walks with device Low fall risk   Focused Assessments Pulmonary Assessment Handoff:  Lung sounds: Bilateral Breath Sounds: Diminished L Breath Sounds: Expiratory wheezes R Breath Sounds: Expiratory wheezes O2 Device: Room Air O2 Flow Rate (L/min): 3 L/min      R Recommendations: See Admitting Provider Note  Report given to:   Additional Notes: Call if any questions

## 2018-12-29 NOTE — Evaluation (Addendum)
Physical Therapy Evaluation Patient Details Name: Haley Burgess MRN: [id number] DOB: 1976/01/23 Today's Date: 12/29/2018   History of Present Illness  Haley Burgess is a 43 y.o. female with medical history significant of copd, dm comes in with worsening sob and wheezing on oral predisone and azithro since wed and not better.  No le edema .  No fevers.  Received several nebs in ed and wheezing better.  No n/v.  Pt referred for admit for copde.    Clinical Impression  Patient functioning near baseline for functional mobility and gait.  Patient able to ambulate in room and hallways without loss of balance while on room air with SpO2 at 90-91%.   Plan:  Patient discharged from physical therapy to care of nursing for ambulation daily as tolerated for length of stay.     Follow Up Recommendations No PT follow up    Equipment Recommendations  None recommended by PT    Recommendations for Other Services       Precautions / Restrictions Precautions Precautions: None Restrictions Weight Bearing Restrictions: No      Mobility  Bed Mobility Overal bed mobility: Modified Independent             General bed mobility comments: increased time  Transfers Overall transfer level: Modified independent               General transfer comment: increased time  Ambulation/Gait Ambulation/Gait assistance: Modified independent (Device/Increase time) Gait Distance (Feet): 120 Feet Assistive device: None Gait Pattern/deviations: WFL(Within Functional Limits) Gait velocity: slightly decreased   General Gait Details: no loss of balance in room and hallways  Stairs            Wheelchair Mobility    Modified Rankin (Stroke Patients Only)       Balance Overall balance assessment: No apparent balance deficits (not formally assessed)                                           Pertinent Vitals/Pain Pain Assessment: No/denies pain    Home Living  Family/patient expects to be discharged to:: Private residence Living Arrangements: Alone Available Help at Discharge: Family Type of Home: House Home Access: Stairs to enter Entrance Stairs-Rails: Right Entrance Stairs-Number of Steps: 2 Home Layout: One level Home Equipment: Cane - single point;Bedside commode;Wheelchair - [employer] - 2 wheels Additional Comments: Patient will be staying with her mother for a couple of days after discharge, her mother's home has 2 steps with left siderail, single story home    Prior Function Level of Independence: Independent         Comments: community ambulator, drives     Higher education careers adviser        Extremity/Trunk Assessment   Upper Extremity Assessment Upper Extremity Assessment: Defer to OT evaluation    Lower Extremity Assessment Lower Extremity Assessment: Overall WFL for tasks assessed    Cervical / Trunk Assessment Cervical / Trunk Assessment: Normal  Communication   Communication: No difficulties  Cognition Arousal/Alertness: Awake/alert Behavior During Therapy: WFL for tasks assessed/performed Overall Cognitive Status: Within Functional Limits for tasks assessed                                        General Comments      Exercises  Assessment/Plan    PT Assessment Patent does not need any further PT services  PT Problem List         PT Treatment Interventions      PT Goals (Current goals can be found in the Care Plan section)  Acute Rehab PT Goals Patient Stated Goal: return home PT Goal Formulation: With patient Time For Goal Achievement: 12/29/18 Potential to Achieve Goals: Good    Frequency     Barriers to discharge        Co-evaluation               AM-PAC PT "6 Clicks" Mobility  Outcome Measure Help needed turning from your back to your side while in a flat bed without using bedrails?: None Help needed moving from lying on your back to sitting on the side of a flat  bed without using bedrails?: None Help needed moving to and from a bed to a chair (including a wheelchair)?: None Help needed standing up from a chair using your arms (e.g., wheelchair or bedside chair)?: None Help needed to walk in hospital room?: None Help needed climbing 3-5 steps with a railing? : None 6 Click Score: 24    End of Session   Activity Tolerance: Patient tolerated treatment well Patient left: in bed;with call bell/phone within reach(seated at bedside) Nurse Communication: Mobility status PT Visit Diagnosis: Unsteadiness on feet (R26.81);Other abnormalities of gait and mobility (R26.89);Muscle weakness (generalized) (M62.81)    Time: 9470-9628 PT Time Calculation (min) (ACUTE ONLY): 21 min   Charges:   PT Evaluation $PT Eval Moderate Complexity: 1 Mod PT Treatments $Gait Training: 8-22 mins        12:04 PM, 12/29/18 Ocie Bob, MPT Physical Therapist with [facility] [phone] office [phone] mobile phone

## 2018-12-29 NOTE — Care Management Important Message (Deleted)
Important Message  Patient Details  Name: Haley Burgess MRN: [id number] Date of Birth: [birth year]   Medicare Important Message Given:  Yes    Annice Needy, LCSW 12/29/2018, 3:36 PM

## 2018-12-29 NOTE — Progress Notes (Signed)
Initial Nutrition Assessment  DOCUMENTATION CODES:   Morbid obesity  INTERVENTION:  Provided nutrition education handouts: Weight Loss Tips  Nutrition Therapy for COPD   NUTRITION DIAGNOSIS:   Limited adherence to nutrition-related recommendations related to limited prior education(- lack of motiviation of understanding of relationship between excess caloric intake and  weight  maintainance of loss evidenced by  her BMI of >50 (morbid obesity).   GOAL:   (Patient will begin to understand the importance of healthy food and beverage choices and  self education in order to improve her weight status and potentially her overall  health)     REASON FOR ASSESSMENT:   Consult Assessment of nutrition requirement/status  ASSESSMENT: Patient is a 43 yo female with a hx of HTN, DM-2,Renal insufficiency and COPD. She presents complaining of wheezing.   Patient has a good appetite - consistent to maintain stable wt range noted below.   Patient is an obese female with a stable wt history 141-143 kg. Recommend gradual weight loss of 5-10% body weight and provided handouts- Weight Loss Tips and Nutrition Therapy for COPD.    Nutrition physical exam: as anticipated in an obese female of her age.  Medications reviewed and include: lipitor, SSI, Prednisone, MVI, Gabapentin.   Labs: BMP Latest Ref Rng & Units 12/29/2018 12/28/2018 10/08/2018  Glucose 70 - 99 mg/dL 981(X) 914(N) 829(F)  BUN 6 - 20 mg/dL 62(Z) 30(Q) 18  Creatinine 0.44 - 1.00 mg/dL 6.57(Q) 4.69 6.29  Sodium 135 - 145 mmol/L 141 139 140  Potassium 3.5 - 5.1 mmol/L 4.8 3.8 4.1  Chloride 98 - 111 mmol/L 109 106 108  CO2 22 - 32 mmol/L 22 24 25   Calcium 8.9 - 10.3 mg/dL 5.2(W) 4.1(L) 9.0      Diet Order:   Diet Order            Diet heart healthy/carb modified Room service appropriate? Yes; Fluid consistency: Thin  Diet effective now              EDUCATION NEEDS: addressed    Skin:  Skin Assessment: Reviewed RN  Assessment  Last BM:  none documented- pt independent with tolieting  Height:   Ht Readings from Last 1 Encounters:  12/29/18 5\' 3"  (1.6 m)    Weight:   Wt Readings from Last 1 Encounters:  12/29/18 (!) 140.6 kg    Ideal Body Weight:  52.2 kg  BMI:  Body mass index is 54.91 kg/m.  Estimated Nutritional Needs: MSJ x1.1  Kcal:  2440-1027 -for weight maintainance  Protein:  90-95 gr (~1.7-1.8 gr/kg/ibw)  Fluid:  1 ml per kcal minimum    Royann Shivers MS,RD,CSG,LDN Office: [phone] Pager: [phone]

## 2018-12-29 NOTE — TOC Initial Note (Signed)
Transition of Care Haley Burgess) - Initial/Assessment Note    Patient Details  Name: Haley Burgess MRN: [id number] Date of Birth: 1975-11-10  Transition of Care Haley Burgess) CM/SW Contact:    Annice Needy, LCSW Phone Number: 12/29/2018, 3:22 PM  Clinical Narrative:                 At baseline, patient is independent. She drives and completes ADLs independently. Patient identifies her supports as her mother (who lives 5-10 minutes away), her two step daughters and their significant others, her two sisters, and brother. She ambulates independently. She reports that her husband died about a year ago and she still has his DME in the home consisting of a wheelchair, walker, cane and bedside commode if she needed to uses it. She reports being active with her PCP and had an appointment that the contacted her this morning to reschedule due to the PCP being unavailable. Patient advised the PCP that she was in the Burgess.  Patient stated that she maintains her appointments and has adequate transportation. Patient also sees a pulmonologist, Haley Burgess. Patient is on home o2 PRN. She reports that she has no needs.     Barriers to Discharge: No Barriers Identified   Patient Goals and CMS Choice Patient states their goals for this hospitalization and ongoing recovery are:: To discharge home and resume independent lifestyle.  CMS Medicare.gov Compare Post Acute Care list provided to:: Patient Choice offered to / list presented to : NA  Expected Discharge Plan and Services   Discharge Planning Services: CM Consult Post Acute Care Choice: NA(n/a) Living arrangements for the past 2 months: Single Family Home Expected Discharge Date: 12/30/18                        Prior Living Arrangements/Services Living arrangements for the past 2 months: Single Family Home Lives with:: Self Patient language and need for interpreter reviewed:: No Do you feel safe going back to the place where you live?: Yes      Need  for Family Participation in Patient Care: No (Comment) Care giver support system in place?: Yes (comment) Current home services: DME(Patient reports that she has a walker, cane, bedside commode and wheelchair that belonged to her deceased husband.) Criminal Activity/Legal Involvement Pertinent to Current Situation/Hospitalization: No - Comment as needed  Activities of Daily Living Home Assistive Devices/Equipment: None ADL Screening (condition at time of admission) Patient's cognitive ability adequate to safely complete daily activities?: Yes Is the patient deaf or have difficulty hearing?: No Does the patient have difficulty seeing, even when wearing glasses/contacts?: No Does the patient have difficulty concentrating, remembering, or making decisions?: No Patient able to express need for assistance with ADLs?: Yes Does the patient have difficulty dressing or bathing?: No Independently performs ADLs?: Yes (appropriate for developmental age) Does the patient have difficulty walking or climbing stairs?: No Weakness of Legs: None Weakness of Arms/Hands: None  Permission Sought/Granted Permission sought to share information with : PCP    Share Information with NAME: [place] [facility] to [employer], Deidre Day, FNP  Permission granted to share info w AGENCY: [employer] to [employer], Deidre [place], FNP  Permission granted to share info w Relationship: PCP office (Berline Lopes, FNP)  Permission granted to share info w Contact Information: PCP office  Emotional Assessment Appearance:: Appears older than stated age Attitude/Demeanor/Rapport: Gracious, Engaged Affect (typically observed): Accepting, Calm Orientation: : Oriented to Self, Oriented to Place, Oriented to  Time,  Oriented to Situation Alcohol / Substance Use: Not Applicable Psych Involvement: No (comment)  Admission diagnosis:  COPD exacerbation (HCC) [J44.1] Patient Active Problem List   Diagnosis Date Noted   . COPD exacerbation (HCC) 12/28/2018  . 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 Sepsis due to undetermined organism (HCC) 04/20/2018  . Acute pyelonephritis 04/20/2018  . AKI (acute kidney injury) (HCC)   . Hydronephrosis due to obstruction of ureter 04/19/2018  . Acute renal injury (HCC) 04/19/2018  . Acute lower UTI 04/19/2018  . Lactic acidosis 04/19/2018  . Hypertension   . Diabetes mellitus without complication (HCC)   . COPD (chronic obstructive pulmonary disease) (HCC)    PCP:  Bridgette Habermann, FNP Pharmacy:   [facility] [address] [place] - [address] [place] [address] Phone: [phone] Fax: [phone]  [address] - [place], [place] - [address] [place] [address] Phone: [phone] Fax: [phone]     Social Determinants of Health (SDOH) Interventions    Readmission Risk Interventions  No flowsheet data found.

## 2018-12-30 DIAGNOSIS — I10 Essential (primary) hypertension: Secondary | ICD-10-CM | POA: Diagnosis not present

## 2018-12-30 DIAGNOSIS — J441 Chronic obstructive pulmonary disease with (acute) exacerbation: Secondary | ICD-10-CM | POA: Diagnosis not present

## 2018-12-30 DIAGNOSIS — Z6841 Body Mass Index (BMI) 40.0 and over, adult: Secondary | ICD-10-CM

## 2018-12-30 DIAGNOSIS — E119 Type 2 diabetes mellitus without complications: Secondary | ICD-10-CM | POA: Diagnosis not present

## 2018-12-30 DIAGNOSIS — K219 Gastro-esophageal reflux disease without esophagitis: Secondary | ICD-10-CM

## 2018-12-30 LAB — GLUCOSE, CAPILLARY
GLUCOSE-CAPILLARY: 182 mg/dL — AB (ref 70–99)
GLUCOSE-CAPILLARY: 208 mg/dL — AB (ref 70–99)
Glucose-Capillary: 224 mg/dL — ABNORMAL HIGH (ref 70–99)

## 2018-12-30 MED ORDER — TIOTROPIUM BROMIDE MONOHYDRATE 18 MCG IN CAPS: 18.0000 ug | ORAL_CAPSULE | Freq: Every day | RESPIRATORY_TRACT | 3 refills | Status: AC

## 2018-12-30 MED ORDER — PREDNISONE 20 MG PO TABS: ORAL_TABLET | ORAL | 0 refills | Status: AC

## 2018-12-30 MED ORDER — PREDNISONE 20 MG PO TABS
60.0000 mg | ORAL_TABLET | Freq: Every day | ORAL | Status: DC
  Administered 2018-12-30: 60 mg via ORAL
  Filled 2018-12-30: qty 3

## 2018-12-30 MED ORDER — DOXYCYCLINE HYCLATE 100 MG PO TABS: 100.0000 mg | ORAL_TABLET | Freq: Two times a day (BID) | ORAL | 0 refills | Status: AC

## 2018-12-30 MED ORDER — ENOXAPARIN SODIUM 80 MG/0.8ML ~~LOC~~ SOLN: 70.0000 mg | SUBCUTANEOUS | Status: DC

## 2018-12-30 MED ORDER — DM-GUAIFENESIN ER 30-600 MG PO TB12: 1.0000 | ORAL_TABLET | Freq: Two times a day (BID) | ORAL | 0 refills | Status: AC

## 2018-12-30 NOTE — Progress Notes (Signed)
Inpatient Diabetes Program Recommendations  AACE/ADA: New Consensus Statement on Inpatient Glycemic Control   Target Ranges:  Prepandial:   less than 140 mg/dL      Peak postprandial:   less than 180 mg/dL (1-2 hours)      Critically ill patients:  140 - 180 mg/dL   Results for CURTINA, ATLAS (MRN [id number]) as of 12/30/2018 08:00  Ref. Range 12/29/2018 11:52 12/29/2018 21:51 12/30/2018 07:17  Glucose-Capillary Latest Ref Range: 70 - 99 mg/dL 416 (H) 384 (H) 536 (H)   Review of Glycemic Control  Diabetes history: DM2 Outpatient Diabetes medications:  Glipizide 5 mg QPM Current orders for Inpatient glycemic control: Novolog 0-20 units TID with meals, Novolog 0-5 units QHS; Solumedrol 80 mg Q12H  Inpatient Diabetes Program Recommendations:  Insulin - Meal Coverage: If steroids are continued, please consider ordering Novolog 4 units TID with meals for meal coverage if patient eats at least 50% of meals.  Thanks, Orlando Penner, RN, MSN, CDE Diabetes Coordinator Inpatient Diabetes Program [phone] (Team Pager from 8am to 5pm)

## 2018-12-30 NOTE — TOC Transition Note (Signed)
Transition of Care [facility]) - CM/SW Discharge Note   Patient Details  Name: Haley Burgess MRN: [id number] Date of Birth: [birth year]  Transition of Care [facility]) CM/SW Contact:  Annice Needy, LCSW Phone Number: 12/30/2018, 2:20 PM   Clinical Narrative:    Patient is discharging home with self care. She has a follow up appointment scheduled for 9 days from today which was the first available appointment.    Final next level of care: Home/Self Care Barriers to Discharge: No Barriers Identified   Patient Goals and CMS Choice Patient states their goals for this hospitalization and ongoing recovery are:: To discharge home and resume independent lifestyle.  CMS Medicare.gov Compare Post Acute Care list provided to:: Patient Choice offered to / list presented to : NA  Discharge Placement                       Discharge Plan and Services Discharge Planning Services: CM Consult Post Acute Care Choice: NA(n/a)                    Social Determinants of Health (SDOH) Interventions     Readmission Risk Interventions Readmission Risk Prevention Plan 12/30/2018 12/29/2018  Transportation Screening - Complete  PCP or Specialist Appt within 3-5 Days Not Complete -  Not Complete comments Patient's PCP is out of the office until April. She was given the first available appointment which was 9 days out.  -  HRI or Home Care Consult - Not Complete  HRI or Home Care Consult comments - Patient has no needs currently.   Social Work Consult for Recovery Care Planning/Counseling - Complete  Palliative Care Screening - Not Applicable  Medication Review [employer]) Complete -  Some recent data might be hidden

## 2018-12-30 NOTE — Discharge Summary (Signed)
Physician Discharge Summary  Haley Burgess OYD:[id number] DOB: [birth year] DOA: 12/28/2018  PCP: Bridgette Habermann, FNP  Admit date: 12/28/2018 Discharge date: 12/30/2018  Time spent: 30 minutes  Recommendations for Outpatient Follow-up:  1. Close follow-up to patient's CBGs and A1c with further adjustment on hypoglycemic regimen as needed. 2. Assist patient in her weight loss journey with outpatient referral to obesity clinic as needed. 3. Has follow-up with pulmonologist after completing acute treatment for COPD exacerbation; according to further assess PFTs and adjustments on her maintenance therapy.   Discharge Diagnoses:  COPD exacerbation (HCC) Hypertension Diabetes mellitus with neuropathy Morbid obesity with body mass index of 50.0-59.9 in adult [facility]) Gastroesophageal reflux disease Diabetic neuropathy Hyperlipidemia  Discharge Condition: Stable and improved.  Patient discharged home with instruction to follow-up with PCP in 10 days.  Diet recommendation: Low calorie diet, modified carbohydrates and heart healthy diet.  Filed Weights   12/28/18 1204 12/29/18 1019  Weight: (!) 140.6 kg (!) 140.6 kg    History of present illness:  As per H&P written by Dr. Onalee Hua on 12/28/2018 43 y.o.femalewith medical history significant ofcopd, dm comes in with worsening sob and wheezing on oral predisone and azithro since wed and not better. No le edema . No fevers. Received several nebs in ed and wheezing better. No n/v. Pt referred for hospitalization for further evaluation and management of COPD exacerbation.  Hospital Course:  1-COPD exacerbation (HCC) -Improved and stable to continue outpatient management. -Continue steroids tapering, nebulizer/inhaler home management -Discharge with prescriptions for Mucinex and oral doxycycline. -Continue the use of flutter valve as instructed. -Patient started on a Spiriva daily. -No requiring oxygen supplementation at rest at this  time. -Influenza panel and RVP negative -Chest x-ray without acute infiltrative process.  2-essential hypertension -Continue home antihypertensive regimen -Continue heart healthy diet.  3-type 2 diabetes mellitus in a patient with obesity and hyperlipidemia -Elevated CBGs in the setting of a steroid usage. -Resume home oral hypoglycemic agents at discharge. -Encouraged to follow modified carbohydrate diet -A1c 6.9 -Will need close follow-up with PCP to further adjust hypoglycemic regimen as needed.  4-hyperlipidemia -Continue statins  5-morbid obesity with underlying history of obstructive sleep apnea -Body mass index is 54.91 kg/m. -Low calorie diet, portion control and increase physical activity discussed with patient. -Continue the use of CPAP nightly -Most likely with component of OHS.  6-Diabetic neuropathy -Continue gabapentin  Procedures:  See below for x-ray reports.  Consultations:  None  Discharge Exam: Vitals:   12/30/18 0448 12/30/18 0849  BP: 130/68   Pulse: (!) 54   Resp: 16   Temp: (!) 97.5 F (36.4 C)   SpO2: 99% 98%    General: Afebrile, no chest pain, no nausea, no vomiting.  Able to speak in full sentences and expressing just mild shortness of breath on exertion. Cardiovascular: S1 and S2, no rubs, no gallops, unable to assess JVD due to body habitus. Respiratory: Improved air movement bilaterally, no wheezing, no crackles, normal respiratory effort.  Good oxygen saturation on room air at discharge. Abdomen: Obese, soft, nontender, nondistended, positive bowel sounds. Extremities: No cyanosis or clubbing.  Discharge Instructions   Discharge Instructions    Diet - low sodium heart healthy   Complete by:  As directed    Discharge instructions   Complete by:  As directed    Take medications as prescribed Keep yourself hydrated  Follow low calorie, heart healthy and modified carbohydrates diet Arrange follow up with PCP in 10 days  Allergies as of 12/30/2018      Reactions   Lortab [hydrocodone-acetaminophen] Other (See Comments)   Bad headache   Latex Rash      Medication List    STOP taking these medications   azithromycin 250 MG tablet Commonly known as:  ZITHROMAX     TAKE these medications   Albuterol Sulfate 108 (90 Base) MCG/ACT Aepb Inhale 1-2 puffs into the lungs every 6 (six) hours as needed (for shortness of breath/wheezing).   albuterol (2.5 MG/3ML) 0.083% nebulizer solution Commonly known as:  PROVENTIL Take 2.5 mg by nebulization every 6 (six) hours as needed for wheezing or shortness of breath.   amLODipine 10 MG tablet Commonly known as:  NORVASC Take 10 mg by mouth every evening.   atorvastatin 20 MG tablet Commonly known as:  LIPITOR Take 20 mg by mouth every evening.   budesonide-formoterol 160-4.5 MCG/ACT inhaler Commonly known as:  SYMBICORT Inhale 2 puffs into the lungs 2 (two) times daily. Per pt only using as needed   dextromethorphan-guaiFENesin 30-600 MG 12hr tablet Commonly known as:  MUCINEX DM Take 1 tablet by mouth 2 (two) times daily.   doxycycline 100 MG tablet Commonly known as:  VIBRA-TABS Take 1 tablet (100 mg total) by mouth every 12 (twelve) hours.   fluticasone 50 MCG/ACT nasal spray Commonly known as:  FLONASE Place 1 spray into both nostrils every evening.   furosemide 40 MG tablet Commonly known as:  LASIX Take 40 mg by mouth daily as needed for fluid or edema.   gabapentin 300 MG capsule Commonly known as:  NEURONTIN Take 300 mg by mouth 3 (three) times daily.   glipiZIDE 5 MG tablet Commonly known as:  GLUCOTROL Take 5 mg by mouth every evening.   hydrOXYzine 25 MG tablet Commonly known as:  ATARAX/VISTARIL Take 25 mg by mouth at bedtime.   losartan 25 MG tablet Commonly known as:  COZAAR Take 25 mg by mouth every evening.   multivitamin with minerals tablet Take 1 tablet by mouth daily.   omega-3 fish oil 1000 MG Caps  capsule Commonly known as:  MAXEPA Take 1 capsule by mouth 2 (two) times daily.   OVER THE COUNTER MEDICATION Take 2 tablets by mouth every evening. BEET ROOT   predniSONE 20 MG tablet Commonly known as:  DELTASONE Take 3 tablets by mouth daily X 2 days; take 2 tablets by mouth daily X 2 days; take 1 tablet by mouth daily X 3 days; then 1/2 tablet by mouth daily X 3 days and stop prednisone What changed:  additional instructions   tiotropium 18 MCG inhalation capsule Commonly known as:  Spiriva HandiHaler Place 1 capsule (18 mcg total) into inhaler and inhale daily.   TURMERIC PO Take 1 tablet by mouth every evening.      Allergies  Allergen Reactions  . Lortab [Hydrocodone-Acetaminophen] Other (See Comments)    Bad headache  . Latex Rash   Follow-up Information    Bridgette Habermann, FNP .   Specialty:  General Practice Contact information: [address] [place] [address] [phone]           The results of significant diagnostics from this hospitalization (including imaging, microbiology, ancillary and laboratory) are listed below for reference.    Significant Diagnostic Studies: Dg Chest 2 View  Result Date: 12/28/2018 CLINICAL DATA:  Patient with shortness of breath. Clinical and congestion. EXAM: CHEST - 2 VIEW COMPARISON:  Lesion (2020 FINDINGS: On the monitoring leads overlie the patient.  Stable enlarged cardiac and mediastinal contours. Bilateral interstitial pulmonary opacities. No pleural effusion or pneumothorax. IMPRESSION: Cardiomegaly. Interstitial opacities which may represent mild edema Electronically Signed   By: Annia Belt M.D.   On: 12/28/2018 14:05   Dg Chest 2 View  Result Date: 12/24/2018 CLINICAL DATA:  Cough and dyspnea x2 days EXAM: CHEST - 2 VIEW COMPARISON:  07/29/2018 FINDINGS: Stable borderline cardiomegaly. Nonaneurysmal thoracic aorta. Diffuse interstitial prominence is noted that may reflect bronchitic change. No alveolar  consolidation, effusion or pneumothorax. No acute osseous abnormality. IMPRESSION: Mild interstitial prominence bilaterally that may reflect acute bronchitic change. Electronically Signed   By: Tollie Eth M.D.   On: 12/24/2018 21:17    Microbiology: Recent Results (from the past 240 hour(s))  Respiratory Panel by PCR     Status: Abnormal   Collection Time: 12/28/18  8:01 PM  Result Value Ref Range Status   Adenovirus NOT DETECTED NOT DETECTED Final   Coronavirus 229E NOT DETECTED NOT DETECTED Final    Comment: (NOTE) The Coronavirus on the Respiratory Panel, DOES NOT test for the novel  Coronavirus (2019 nCoV)    Coronavirus HKU1 NOT DETECTED NOT DETECTED Final   Coronavirus NL63 NOT DETECTED NOT DETECTED Final   Coronavirus OC43 NOT DETECTED NOT DETECTED Final   Metapneumovirus DETECTED (A) NOT DETECTED Final   Rhinovirus / Enterovirus NOT DETECTED NOT DETECTED Final   Influenza A NOT DETECTED NOT DETECTED Final   Influenza B NOT DETECTED NOT DETECTED Final   Parainfluenza Virus 1 NOT DETECTED NOT DETECTED Final   Parainfluenza Virus 2 NOT DETECTED NOT DETECTED Final   Parainfluenza Virus 3 NOT DETECTED NOT DETECTED Final   Parainfluenza Virus 4 NOT DETECTED NOT DETECTED Final   Respiratory Syncytial Virus NOT DETECTED NOT DETECTED Final   Bordetella pertussis NOT DETECTED NOT DETECTED Final   Chlamydophila pneumoniae NOT DETECTED NOT DETECTED Final   Mycoplasma pneumoniae NOT DETECTED NOT DETECTED Final    Comment: Performed at [facility], [address], [place], [place] [address]     Labs: Basic Metabolic Panel: Recent Labs  Lab 12/28/18 1249 12/29/18 0443  NA 139 141  K 3.8 4.8  CL 106 109  CO2 24 22  GLUCOSE 190* 219*  BUN 24* 24*  CREATININE 0.91 1.04*  CALCIUM 8.4* 8.8*   Liver Function Tests: Recent Labs  Lab 12/28/18 1249  AST 27  ALT 32  ALKPHOS 75  BILITOT 0.7  PROT 6.3*  ALBUMIN 3.4*   CBC: Recent Labs  Lab 12/28/18 1249  12/29/18 0443  WBC 5.9 5.4  NEUTROABS 3.9 4.4  HGB 12.4 12.9  HCT 38.4 40.3  MCV 91.6 92.2  PLT 191 214   Cardiac Enzymes: Recent Labs  Lab 12/28/18 1253  TROPONINI <0.03   BNP: BNP (last 3 results) Recent Labs    10/03/18 1428 12/28/18 1249  BNP 33.0 19.0   CBG: Recent Labs  Lab 12/29/18 1152 12/29/18 1555 12/29/18 2151 12/30/18 0717 12/30/18 1124  GLUCAP 290* 224* 248* 182* 208*    Signed:  Vassie Loll MD.  [facility] 12/30/2018, 2:06 PM

## 2018-12-30 NOTE — Progress Notes (Signed)
IV discontinued,catheter intact. Discharge instructions given on medications and follow up visits,patient verbalized understanding. Prescriptions sent to Pharmacy of choice documented AVS. Accompanied by staff to an awaiting vehicle.

## 2019-10-06 ENCOUNTER — Encounter: Payer: Self-pay | Admitting: Urology

## 2023-01-14 ENCOUNTER — Other Ambulatory Visit: Payer: Self-pay

## 2023-01-14 ENCOUNTER — Encounter (HOSPITAL_COMMUNITY): Payer: Self-pay | Admitting: Emergency Medicine

## 2023-01-14 ENCOUNTER — Emergency Department (HOSPITAL_COMMUNITY): Payer: Medicare HMO

## 2023-01-14 ENCOUNTER — Emergency Department (HOSPITAL_COMMUNITY)
Admission: EM | Admit: 2023-01-14 | Discharge: 2023-01-14 | Disposition: A | Discharge status: Home / Self Care | Payer: Medicare HMO | Attending: Emergency Medicine | Admitting: Emergency Medicine

## 2023-01-14 DIAGNOSIS — R109 Unspecified abdominal pain: Secondary | ICD-10-CM | POA: Diagnosis not present

## 2023-01-14 DIAGNOSIS — E119 Type 2 diabetes mellitus without complications: Secondary | ICD-10-CM | POA: Diagnosis not present

## 2023-01-14 DIAGNOSIS — R110 Nausea: Secondary | ICD-10-CM | POA: Diagnosis not present

## 2023-01-14 DIAGNOSIS — Z7984 Long term (current) use of oral hypoglycemic drugs: Secondary | ICD-10-CM | POA: Insufficient documentation

## 2023-01-14 DIAGNOSIS — Z79899 Other long term (current) drug therapy: Secondary | ICD-10-CM | POA: Diagnosis not present

## 2023-01-14 DIAGNOSIS — I10 Essential (primary) hypertension: Secondary | ICD-10-CM | POA: Diagnosis not present

## 2023-01-14 DIAGNOSIS — Z91040 Latex allergy status: Secondary | ICD-10-CM | POA: Diagnosis not present

## 2023-01-14 DIAGNOSIS — J449 Chronic obstructive pulmonary disease, unspecified: Secondary | ICD-10-CM | POA: Insufficient documentation

## 2023-01-14 LAB — COMPREHENSIVE METABOLIC PANEL
ALT: 35 U/L (ref 0–44)
AST: 33 U/L (ref 15–41)
Albumin: 3.7 g/dL (ref 3.5–5.0)
Alkaline Phosphatase: 60 U/L (ref 38–126)
Anion gap: 10 (ref 5–15)
BUN: 13 mg/dL (ref 6–20)
CO2: 22 mmol/L (ref 22–32)
Calcium: 8.6 mg/dL — ABNORMAL LOW (ref 8.9–10.3)
Chloride: 107 mmol/L (ref 98–111)
Creatinine, Ser: 0.94 mg/dL (ref 0.44–1.00)
GFR, Estimated: >60 mL/min (ref >60)
Glucose, Bld: 134 mg/dL — ABNORMAL HIGH (ref 70–99)
Potassium: 3.5 mmol/L (ref 3.5–5.1)
Sodium: 139 mmol/L (ref 135–145)
Total Bilirubin: 0.7 mg/dL (ref 0.3–1.2)
Total Protein: 6.4 g/dL — ABNORMAL LOW (ref 6.5–8.1)

## 2023-01-14 LAB — CBC WITH DIFFERENTIAL/PLATELET
Abs Immature Granulocytes: 0.02 10*3/uL (ref 0.00–0.07)
Basophils Absolute: 0 10*3/uL (ref 0.0–0.1)
Basophils Relative: 1 %
Eosinophils Absolute: 0.2 10*3/uL (ref 0.0–0.5)
Eosinophils Relative: 2 %
HCT: 41.9 % (ref 36.0–46.0)
Hemoglobin: 14.4 g/dL (ref 12.0–15.0)
Immature Granulocytes: 0 %
Lymphocytes Relative: 21 %
Lymphs Abs: 1.7 10*3/uL (ref 0.7–4.0)
MCH: 31.4 pg (ref 26.0–34.0)
MCHC: 34.4 g/dL (ref 30.0–36.0)
MCV: 91.5 fL (ref 80.0–100.0)
Monocytes Absolute: 0.4 10*3/uL (ref 0.1–1.0)
Monocytes Relative: 5 %
Neutro Abs: 5.9 10*3/uL (ref 1.7–7.7)
Neutrophils Relative %: 71 %
Platelets: 191 10*3/uL (ref 150–400)
RBC: 4.58 MIL/uL (ref 3.87–5.11)
RDW: 14.2 % (ref 11.5–15.5)
WBC: 8.2 10*3/uL (ref 4.0–10.5)
nRBC: 0 % (ref 0.0–0.2)

## 2023-01-14 LAB — URINALYSIS, ROUTINE W REFLEX MICROSCOPIC
Bilirubin Urine: NEGATIVE
Glucose, UA: NEGATIVE mg/dL
Hgb urine dipstick: NEGATIVE
Ketones, ur: NEGATIVE mg/dL
Leukocytes,Ua: NEGATIVE
Nitrite: NEGATIVE
Protein, ur: NEGATIVE mg/dL
Specific Gravity, Urine: 1.004 — ABNORMAL LOW (ref 1.005–1.030)
pH: 8 (ref 5.0–8.0)

## 2023-01-14 LAB — LIPASE, BLOOD: Lipase: 30 U/L (ref 11–51)

## 2023-01-14 MED ORDER — ONDANSETRON 4 MG PO TBDP: 4.0000 mg | ORAL_TABLET | Freq: Three times a day (TID) | ORAL | 0 refills | Status: AC | PRN

## 2023-01-14 MED ORDER — ONDANSETRON HCL 4 MG/2ML IJ SOLN
4.0000 mg | Freq: Once | INTRAMUSCULAR | Status: AC
  Administered 2023-01-14: 4 mg via INTRAVENOUS
  Filled 2023-01-14: qty 2

## 2023-01-14 NOTE — ED Provider Notes (Signed)
[facility] Provider Note   CSN: [id number] Arrival date & time: 01/14/23  1015     History  No chief complaint on file.   Haley Burgess is a 47 y.o. female.  Patient with history of hypertension, diabetes, COPD, right sided nephrectomy presents today with complaints of left flank pain and nausea. She states that same began 2 days ago and has been persistent since then. She states that 2 weeks ago she was having dysuria and 1 week ago went to her pcp and was diagnosed with a UTI and started on Keflex. She has been taking this as prescribed and has 1 pill left to take tonight. She is no longer having dysuria. She has been checking her temperature regularly and denies any fevers or chills. She is concerned as this is similar to how she felt in 2019 when she had sepsis from a kidney stone and had a nephrectomy. She denies and vomiting or diarrhea. No hematuria. No chest pain, shortness of breath, leg pain, or leg swelling.   The history is provided by the patient. No language interpreter was used.       Home Medications Prior to Admission medications   Medication Sig Start Date End Date Taking? Authorizing Provider  albuterol (PROVENTIL) (2.5 MG/3ML) 0.083% nebulizer solution Take 2.5 mg by nebulization every 6 (six) hours as needed for wheezing or shortness of breath.  09/24/18   [provider]  Albuterol Sulfate 108 (90 Base) MCG/ACT AEPB Inhale 1-2 puffs into the lungs every 6 (six) hours as needed (for shortness of breath/wheezing).     [provider]  amLODipine (NORVASC) 10 MG tablet Take 10 mg by mouth every evening. 09/29/18   [provider]  atorvastatin (LIPITOR) 20 MG tablet Take 20 mg by mouth every evening.     [provider]  budesonide-formoterol (SYMBICORT) 160-4.5 MCG/ACT inhaler Inhale 2 puffs into the lungs 2 (two) times daily. Per pt only using as needed    [provider]   dextromethorphan-guaiFENesin (MUCINEX DM) 30-600 MG 12hr tablet Take 1 tablet by mouth 2 (two) times daily. 12/30/18   Barton Dubois, MD  doxycycline (VIBRA-TABS) 100 MG tablet Take 1 tablet (100 mg total) by mouth every 12 (twelve) hours. 12/30/18   Barton Dubois, MD  fluticasone (FLONASE) 50 MCG/ACT nasal spray Place 1 spray into both nostrils every evening.     [provider]  furosemide (LASIX) 40 MG tablet Take 40 mg by mouth daily as needed for fluid or edema.    [provider]  gabapentin (NEURONTIN) 300 MG capsule Take 300 mg by mouth 3 (three) times daily.    [provider]  glipiZIDE (GLUCOTROL) 5 MG tablet Take 5 mg by mouth every evening.     [provider]  hydrOXYzine (ATARAX/VISTARIL) 25 MG tablet Take 25 mg by mouth at bedtime.     [provider]  losartan (COZAAR) 25 MG tablet Take 25 mg by mouth every evening. 10/27/18   [provider]  Multiple Vitamins-Minerals (MULTIVITAMIN WITH MINERALS) tablet Take 1 tablet by mouth daily.    [provider]  omega-3 fish oil (MAXEPA) 1000 MG CAPS capsule Take 1 capsule by mouth 2 (two) times daily. 10/22/18   [provider]  OVER THE COUNTER MEDICATION Take 2 tablets by mouth every evening. BEET ROOT    [provider]  predniSONE (DELTASONE) 20 MG tablet Take 3 tablets by mouth daily X 2  days; take 2 tablets by mouth daily X 2 days; take 1 tablet by mouth daily X 3 days; then 1/2 tablet by mouth daily X 3 days and stop prednisone 12/30/18   Barton Dubois, MD  tiotropium (SPIRIVA HANDIHALER) 18 MCG inhalation capsule Place 1 capsule (18 mcg total) into inhaler and inhale daily. 12/30/18 12/30/19  Barton Dubois, MD  TURMERIC PO Take 1 tablet by mouth every evening.    [provider]      Allergies    Lortab [hydrocodone-acetaminophen] and Latex    Review of Systems   Review of Systems  Gastrointestinal:  Positive for nausea.  Genitourinary:   Positive for flank pain.  All other systems reviewed and are negative.   Physical Exam Updated Vital Signs BP 137/85 (BP Location: Right Wrist)   Pulse 70   Temp 98.3 F (36.8 C) (Oral)   Resp 18   Ht 5\' 3"  (1.6 m)   Wt (!) 140 kg   SpO2 100%   BMI 54.67 kg/m  Physical Exam Vitals and nursing note reviewed.  Constitutional:      General: She is not in acute distress.    Appearance: Normal appearance. She is normal weight. She is not ill-appearing, toxic-appearing or diaphoretic.  HENT:     Head: Normocephalic and atraumatic.  Cardiovascular:     Rate and Rhythm: Normal rate and regular rhythm.     Heart sounds: Normal heart sounds.  Pulmonary:     Effort: Pulmonary effort is normal. No respiratory distress.     Breath sounds: Normal breath sounds.  Abdominal:     General: Abdomen is flat.     Palpations: Abdomen is soft.     Tenderness: There is no abdominal tenderness. There is no right CVA tenderness or left CVA tenderness.  Musculoskeletal:        General: Normal range of motion.     Cervical back: Normal range of motion.  Skin:    General: Skin is warm and dry.  Neurological:     General: No focal deficit present.     Mental Status: She is alert.  Psychiatric:        Mood and Affect: Mood normal.        Behavior: Behavior normal.     ED Results / Procedures / Treatments   Labs (all labs ordered are listed, but only abnormal results are displayed) Labs Reviewed  URINALYSIS, ROUTINE W REFLEX MICROSCOPIC - Abnormal; Notable for the following components:      Result Value   Color, Urine STRAW (*)    Specific Gravity, Urine 1.004 (*)    All other components within normal limits  COMPREHENSIVE METABOLIC PANEL - Abnormal; Notable for the following components:   Glucose, Bld 134 (*)    Calcium 8.6 (*)    Total Protein 6.4 (*)    All other components within normal limits  LIPASE, BLOOD  CBC WITH DIFFERENTIAL/PLATELET    EKG None  Radiology CT Renal  Stone Study  Result Date: 01/14/2023 CLINICAL DATA:  UTI.  Recurrent flank pain. EXAM: CT ABDOMEN AND PELVIS WITHOUT CONTRAST TECHNIQUE: Multidetector CT imaging of the abdomen and pelvis was performed following the standard protocol without IV contrast. RADIATION DOSE REDUCTION: This exam was performed according to the departmental dose-optimization program which includes automated exposure control, adjustment of the mA and/or kV according to patient size and/or use of iterative reconstruction technique. COMPARISON:  CT 10/03/2018 FINDINGS: Lower chest: Tiny left pleural effusion.  Mild adjacent opacity.  Hepatobiliary: Fatty liver infiltration.  Gallbladder is nondilated. Pancreas: Unremarkable. No pancreatic ductal dilatation or surrounding inflammatory changes. Spleen: Normal in size without focal abnormality. Adrenals/Urinary Tract: The right adrenal gland is preserved. Adrenal gland has a focal nodule medially measuring 9 mm. Hounsfield unit of 6. Benign adenoma. No specific imaging follow-up. The right kidney is surgically absent. No abnormal soft tissue in the right nephrectomy bed. There is mild left-sided perinephric stranding. No left-sided renal or ureteral stone. Small cystic areas seen anteriorly in the left mid kidney has a diameter of 2.3 cm and Hounsfield unit of 8. No specific imaging follow-up. Preserved contours of the urinary bladder. Stomach/Bowel: Large bowel is of normal course and caliber with scattered stool. Normal appendix. Stomach and small bowel are nondilated. Moderate debris in the stomach. Vascular/Lymphatic: Normal caliber aorta and IVC with scattered vascular calcifications. No specific abnormal lymph node enlargement present in the abdomen and pelvis. Reproductive: Lobular uterus with fibroids. Is also a right adnexal lesion which is stable measuring 5 cm. Likely ovarian. No separate adnexal mass. Other: Areas of presumed fat necrosis along the right hemiabdomen. No free air or  free fluid. Musculoskeletal: Curvature of the spine with some degenerative changes of the spine and pelvis. IMPRESSION: Previous right nephrectomy. No obstructing left-sided renal or ureteral stones. No bowel obstruction, free air or free fluid. Lobular uterus with history of fibroids and separate right ovarian or adnexal lesion, similar to previous. Please correlate for any known prior workup or dedicated evaluation when appropriate such as female pelvic MRI. Tiny left effusion. Electronically Signed   By: Jill Side M.D.   On: 01/14/2023 13:00    Procedures Procedures    Medications Ordered in ED Medications  ondansetron (ZOFRAN) injection 4 mg (4 mg Intravenous Given 01/14/23 1348)    ED Course/ Medical Decision Making/ A&P                             Medical Decision Making Amount and/or Complexity of Data Reviewed Labs: ordered. Radiology: ordered.   This patient is a 47 y.o. female who presents to the ED for concern of left flank pain and nausea, this involves an extensive number of treatment options, and is a complaint that carries with it a high risk of complications and morbidity. The emergent differential diagnosis prior to evaluation includes, but is not limited to,  kidney stone, pyelonephritis, sepsis, UTI, gastroenteritis, muscle strain . This is not an exhaustive differential.   Past Medical History / Co-morbidities / Social History: history of hypertension, diabetes, COPD, right sided nephrectomy  Physical Exam: Physical exam performed. The pertinent findings include: Abdomen soft and nontender.  No CVA tenderness.  Kidney function WNL.  No leukocytosis.  Lab Tests: I ordered, and personally interpreted labs.  The pertinent results include: UA noninfectious,   Imaging Studies: I ordered imaging studies including CT renal. I independently visualized and interpreted imaging which showed   Previous right nephrectomy.   No obstructing left-sided renal or ureteral  stones.   No bowel obstruction, free air or free fluid.   Lobular uterus with history of fibroids and separate right ovarian or adnexal lesion, similar to previous. Please correlate for any known prior workup or dedicated evaluation when appropriate such as female pelvic MRI.   Tiny left effusion.  I agree with the radiologist interpretation.   Medications: I ordered medication including zofran  for nausea. Reevaluation of the patient after these medicines showed that  the patient improved. I have reviewed the patients home medicines and have made adjustments as needed.  Disposition:  Patient's status post right-sided nephrectomy in 2019 presents today with complaints of left flank pain and nausea.  She is afebrile, nontoxic-appearing, and in no acute distress with reassuring vital signs.  Physical exam reveals abdomen soft and nontender with no CVA tenderness.  Laboratory evaluation and CT imaging are benign, no signs of UTI, pyelonephritis, kidney stone, or any other emergent etiology of symptoms.  After Zofran, patient is feeling much better.  She states that her main concern was to ensure that her kidney function was normal as she feels similar to when she had sepsis from a kidney stone requiring nephrectomy.  No signs of this at this time, patient states she feels well enough to go home with prescription for Zofran and close outpatient follow-up.  She is able to tolerate oral intake.  Evaluation and diagnostic testing in the emergency department does not suggest an emergent condition requiring admission or immediate intervention beyond what has been performed at this time.  Plan for discharge with close PCP follow-up.  Patient is understanding and amenable with plan, educated on red flag symptoms that would prompt immediate return.  Patient discharged in stable condition.  Final Clinical Impression(s) / ED Diagnoses Final diagnoses:  Left flank pain  Nausea    Rx / DC Orders ED  Discharge Orders          Ordered    ondansetron (ZOFRAN-ODT) 4 MG disintegrating tablet  Every 8 hours PRN        01/14/23 1357          An After Visit Summary was printed and given to the patient.     Nestor Lewandowsky 01/14/23 1358    Hayden Rasmussen, MD 01/14/23 Jeri Lager

## 2023-01-14 NOTE — Discharge Instructions (Signed)
As we discussed, your workup in the ER today was reassuring for acute findings.  Your kidney function was normal and showed no signs of infection or kidney stone.  I have given you a prescription for Zofran to take for any residual nausea.  I recommend that you follow-up closely with your primary care doctor and call to schedule an appointment at your earliest convenience.  Return if development of any new or worsening symptoms.

## 2023-01-14 NOTE — ED Triage Notes (Signed)
Pt went to PCP last Monday and diagnosed with UTI, started on keflex and has one dose left tonight. States she has one kidney and noticed some dull pain to left flank area. Began feeling nauseous yesterday.
